# Patient Record
Sex: Male | Born: 1965 | Race: Black or African American | Hispanic: No | Marital: Married | Smoking: Never smoker
Health system: Southern US, Community
[De-identification: ages and names within clinical notes are randomized; demographics above are authoritative.]

## PROBLEM LIST (undated history)

## (undated) DIAGNOSIS — I219 Acute myocardial infarction, unspecified: Secondary | ICD-10-CM

## (undated) DIAGNOSIS — I10 Essential (primary) hypertension: Secondary | ICD-10-CM

## (undated) HISTORY — PX: KNEE SURGERY: SHX244

## (undated) HISTORY — PX: HERNIA REPAIR: SHX51

## (undated) HISTORY — PX: SHOULDER SURGERY: SHX246

---

## 2003-10-16 ENCOUNTER — Inpatient Hospital Stay (HOSPITAL_COMMUNITY): Admission: EM | Admit: 2003-10-16 | Discharge: 2003-10-18 | Payer: Self-pay | Admitting: Emergency Medicine

## 2003-10-16 IMAGING — CR DG CHEST 2V
2 series · 2 of 2 positions shown · non-contrast
Comparison: none

CLINICAL DATA: Chest pain. 
 TWO VIEW CHEST ? [DATE]

[view not recorded (1 of 2)]
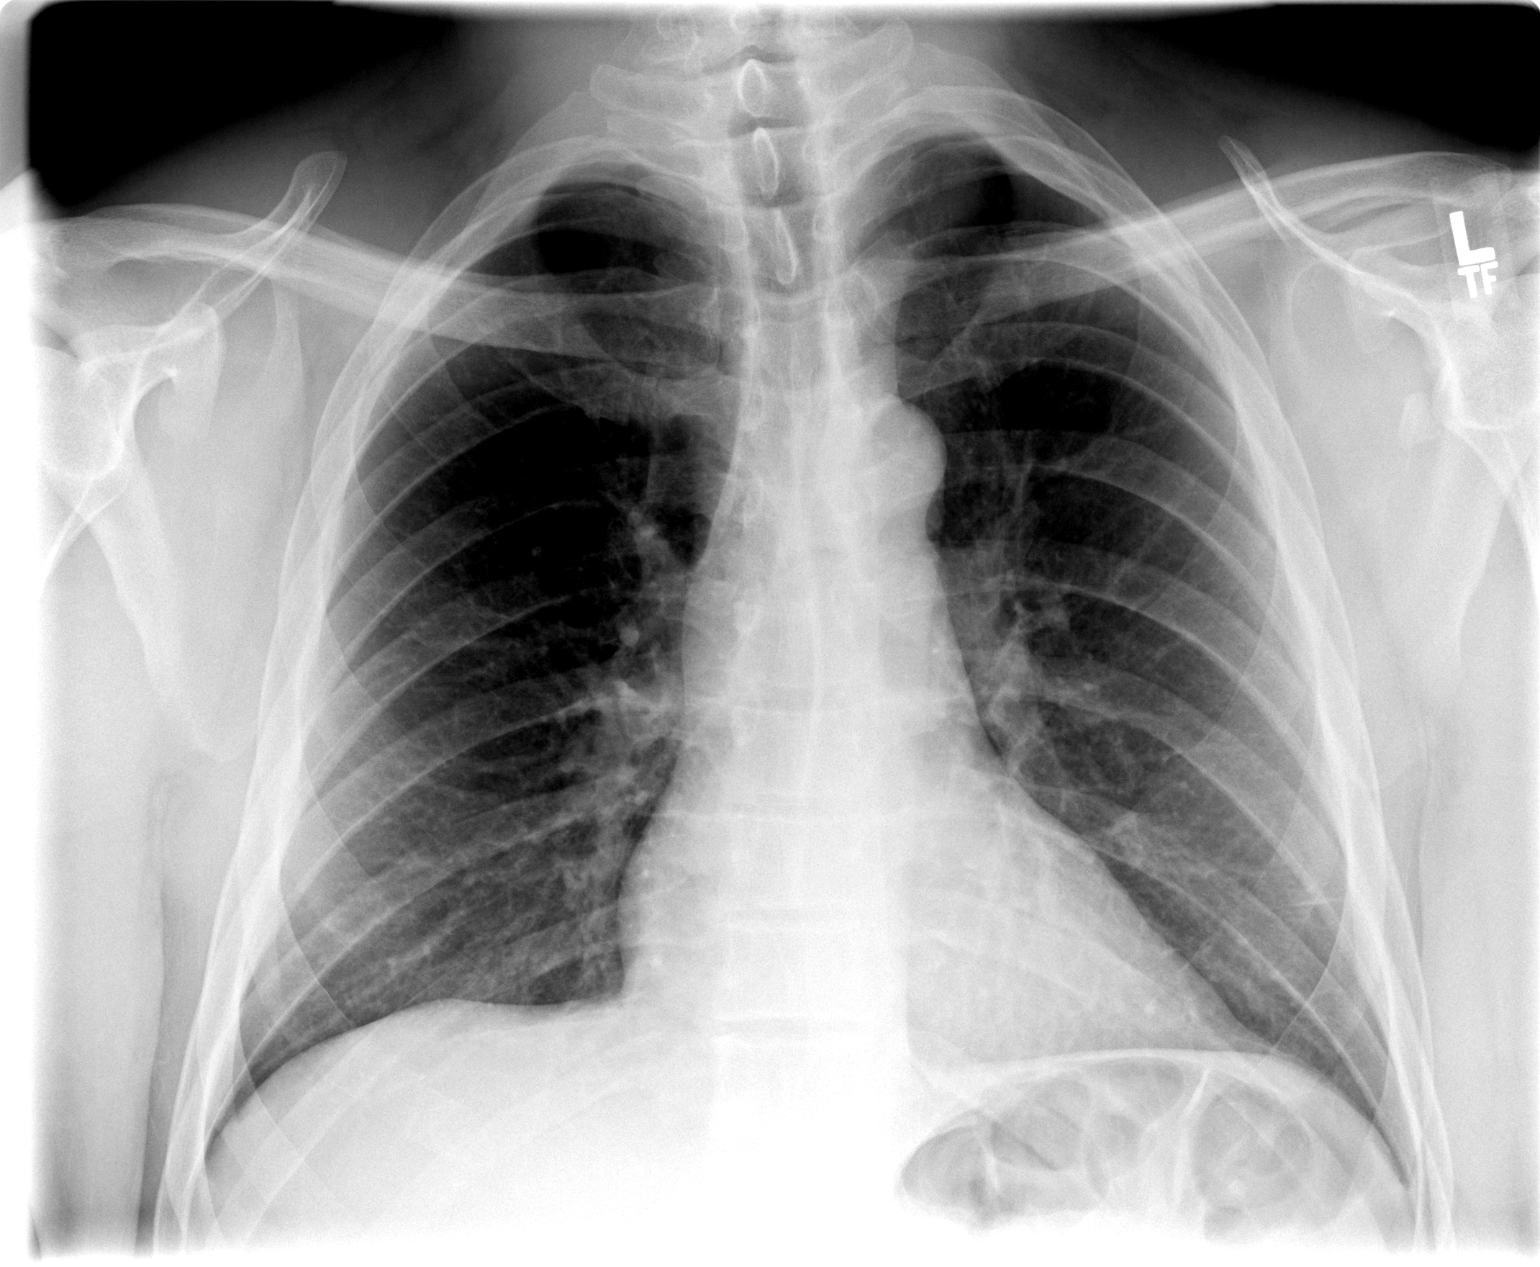

[view not recorded (2 of 2)]
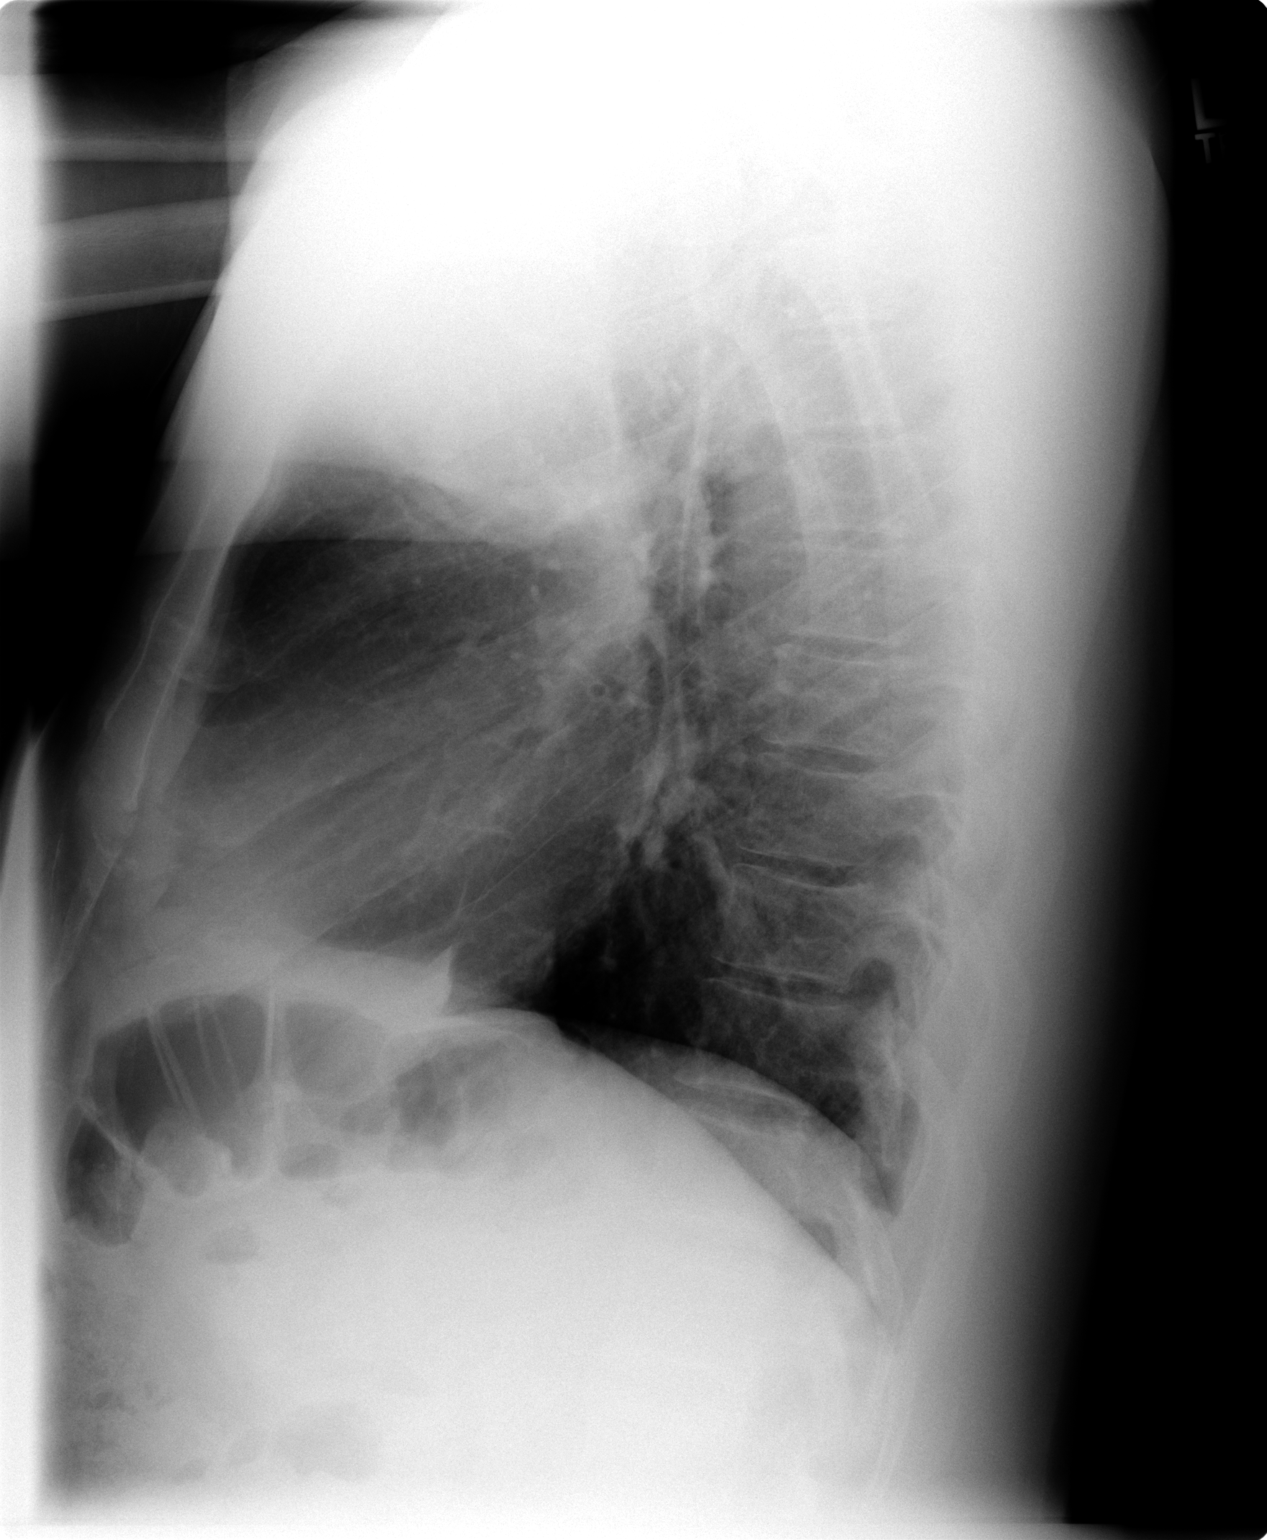

[2 of 2 positions shown; findings below may reference images not displayed]

FINDINGS: Normal cardiomediastinal silhouette.  The lungs are clear.  No evidence of acute cardiopulmonary disease.  
 IMPRESSION
 No evidence of acute cardiopulmonary disease.

## 2003-10-17 IMAGING — NM NM MYOCAR PERF WALL MOTION
2 series · 12 of 12 positions shown · non-contrast
Comparison: none

CLINICAL DATA: This is a 39-year-old man with no known coronary artery disease and atypical chest discomfort who was admitted to [HOSPITAL] and ruled out for myocardial infarction by enzymes.  Had no evidence of ischemic changes on his EKG and this is a risk stratification study.  Of note is this gentleman is incarcerated in [REDACTED] and he gets his primary care from the [facility] system.
 MYOCARDIAL PERFUSION IMAGING SCAN
 [DATE] mCi [SL] Cardiolite was injected at rest.  30.0 mCi [SL] Cardiolite was injected at peak exercise.  
 DETAILS OF THE PROCEDURE:  Following the infusion of low dose Technetium 99m Cardiolite, SPECT imaging of the chest was performed.  The patient then exercised on a Bruce protocol and when he had attained at least 85% of max predicted heart rate for his age, high dose Technetium 99m Cardiolite was infused.  SPECT imaging of the chest was performed.  The images are recreated in the short axis, horizontal long, and vertical long axes, and the post stress images were used in a gating protocol to assess left ventricular, wall motion, and function.  
 RESULTS:  There is uniform perfusion throughout all myocardial segments.  There is no evidence of ischemia or scar.  The overall ejection fraction is 59%.  There are no wall motion abnormalities seen.  
 STRESS TEST:  The patient was able to exercise 6 minutes and 44 seconds of a Bruce protocol attaining 7 mets of exercise.  His heart rate increased from 85 beats a minute to 165 beats a minute which is 92% of his max predicted heart rate for his age.  His blood pressure increased from 120/90 to 208/90.  During that time, he had mild chest discomfort which increased with exertion and resolved with recovery.  He had a few PVCs, but no ischemic ST-T wave changes with the discomfort and exercise.  Reason for stopping the test was fatigue.  
 IMPRESSION
 This is a low risk scan in a patient with no known coronary artery disease.  Clinical correlation is advised.
 CC:  Dr. [facility]

[Series 1: cr cardiac tc low dose · 6.52mm/px · 6 of 64 frames shown]
[frame 6/64]
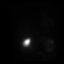
[frame 16/64]
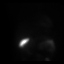
[frame 27/64]
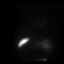
[frame 38/64]
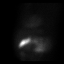
[frame 48/64]
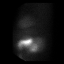
[frame 59/64]
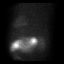

[Series 1: cs cardiac tc hi dose · 6.52mm/px · 6 of 512 frames shown]
[frame 43/512]
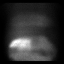
[frame 128/512]
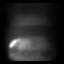
[frame 214/512]
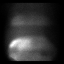
[frame 299/512]
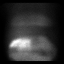
[frame 384/512]
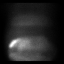
[frame 470/512]
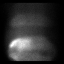

[12 of 12 positions shown; findings below may reference images not displayed]

## 2021-03-30 ENCOUNTER — Encounter (HOSPITAL_COMMUNITY): Payer: Self-pay

## 2021-03-30 ENCOUNTER — Other Ambulatory Visit: Payer: Self-pay

## 2021-03-30 ENCOUNTER — Emergency Department (HOSPITAL_COMMUNITY)

## 2021-03-30 ENCOUNTER — Inpatient Hospital Stay (HOSPITAL_COMMUNITY)
Admission: EM | Admit: 2021-03-30 | Discharge: 2021-04-01 | DRG: 312 | Attending: Internal Medicine | Admitting: Internal Medicine

## 2021-03-30 DIAGNOSIS — R079 Chest pain, unspecified: Secondary | ICD-10-CM | POA: Diagnosis present

## 2021-03-30 DIAGNOSIS — R200 Anesthesia of skin: Secondary | ICD-10-CM

## 2021-03-30 DIAGNOSIS — R202 Paresthesia of skin: Secondary | ICD-10-CM

## 2021-03-30 DIAGNOSIS — E538 Deficiency of other specified B group vitamins: Secondary | ICD-10-CM | POA: Diagnosis present

## 2021-03-30 DIAGNOSIS — I252 Old myocardial infarction: Secondary | ICD-10-CM

## 2021-03-30 DIAGNOSIS — Z20822 Contact with and (suspected) exposure to covid-19: Secondary | ICD-10-CM | POA: Diagnosis present

## 2021-03-30 DIAGNOSIS — I10 Essential (primary) hypertension: Secondary | ICD-10-CM

## 2021-03-30 DIAGNOSIS — R55 Syncope and collapse: Secondary | ICD-10-CM | POA: Diagnosis not present

## 2021-03-30 DIAGNOSIS — Z8673 Personal history of transient ischemic attack (TIA), and cerebral infarction without residual deficits: Secondary | ICD-10-CM

## 2021-03-30 DIAGNOSIS — Z888 Allergy status to other drugs, medicaments and biological substances status: Secondary | ICD-10-CM

## 2021-03-30 DIAGNOSIS — D7589 Other specified diseases of blood and blood-forming organs: Secondary | ICD-10-CM | POA: Diagnosis present

## 2021-03-30 DIAGNOSIS — Z91018 Allergy to other foods: Secondary | ICD-10-CM

## 2021-03-30 DIAGNOSIS — M722 Plantar fascial fibromatosis: Secondary | ICD-10-CM | POA: Diagnosis present

## 2021-03-30 DIAGNOSIS — Z880 Allergy status to penicillin: Secondary | ICD-10-CM

## 2021-03-30 HISTORY — DX: Essential (primary) hypertension: I10

## 2021-03-30 HISTORY — DX: Acute myocardial infarction, unspecified: I21.9

## 2021-03-30 LAB — PROTIME-INR
INR: 0.9 (ref 0.8–1.2)
Prothrombin Time: 12.6 seconds (ref 11.4–15.2)

## 2021-03-30 LAB — URINALYSIS, ROUTINE W REFLEX MICROSCOPIC
Bilirubin Urine: NEGATIVE
Glucose, UA: NEGATIVE mg/dL
Hgb urine dipstick: NEGATIVE
Ketones, ur: NEGATIVE mg/dL
Leukocytes,Ua: NEGATIVE
Nitrite: NEGATIVE
Protein, ur: NEGATIVE mg/dL
Specific Gravity, Urine: 1.014 (ref 1.005–1.030)
pH: 6 (ref 5.0–8.0)

## 2021-03-30 LAB — CBC WITH DIFFERENTIAL/PLATELET
Abs Immature Granulocytes: 0.01 10*3/uL (ref 0.00–0.07)
Basophils Absolute: 0 10*3/uL (ref 0.0–0.1)
Basophils Relative: 1 %
Eosinophils Absolute: 0.3 10*3/uL (ref 0.0–0.5)
Eosinophils Relative: 9 %
HCT: 41.1 % (ref 39.0–52.0)
Hemoglobin: 13.6 g/dL (ref 13.0–17.0)
Immature Granulocytes: 0 %
Lymphocytes Relative: 41 %
Lymphs Abs: 1.7 10*3/uL (ref 0.7–4.0)
MCH: 34.7 pg — ABNORMAL HIGH (ref 26.0–34.0)
MCHC: 33.1 g/dL (ref 30.0–36.0)
MCV: 104.8 fL — ABNORMAL HIGH (ref 80.0–100.0)
Monocytes Absolute: 0.4 10*3/uL (ref 0.1–1.0)
Monocytes Relative: 10 %
Neutro Abs: 1.6 10*3/uL — ABNORMAL LOW (ref 1.7–7.7)
Neutrophils Relative %: 39 %
Platelets: 190 10*3/uL (ref 150–400)
RBC: 3.92 MIL/uL — ABNORMAL LOW (ref 4.22–5.81)
RDW: 11.5 % (ref 11.5–15.5)
WBC: 4 10*3/uL (ref 4.0–10.5)
nRBC: 0 % (ref 0.0–0.2)

## 2021-03-30 LAB — COMPREHENSIVE METABOLIC PANEL
ALT: 14 U/L (ref 0–44)
AST: 15 U/L (ref 15–41)
Albumin: 4.1 g/dL (ref 3.5–5.0)
Alkaline Phosphatase: 105 U/L (ref 38–126)
Anion gap: 6 (ref 5–15)
BUN: 18 mg/dL (ref 6–20)
CO2: 28 mmol/L (ref 22–32)
Calcium: 9.4 mg/dL (ref 8.9–10.3)
Chloride: 103 mmol/L (ref 98–111)
Creatinine, Ser: 1.42 mg/dL — ABNORMAL HIGH (ref 0.61–1.24)
GFR, Estimated: 58 mL/min — ABNORMAL LOW (ref >60)
Glucose, Bld: 91 mg/dL (ref 70–99)
Potassium: 4 mmol/L (ref 3.5–5.1)
Sodium: 137 mmol/L (ref 135–145)
Total Bilirubin: 0.9 mg/dL (ref 0.3–1.2)
Total Protein: 7.6 g/dL (ref 6.5–8.1)

## 2021-03-30 LAB — TROPONIN I (HIGH SENSITIVITY): Troponin I (High Sensitivity): 3 ng/L (ref <18)

## 2021-03-30 IMAGING — CT CT ANGIO HEAD-NECK (W OR W/O PERF)
2 of 7 series · 8 of 33 positions shown · IV contrast (Omnipaque or Isovue)
Comparison: Prior head CT from earlier the same day.

CLINICAL DATA: Initial evaluation for neuro deficit, stroke
suspected.

EXAM:
CT ANGIOGRAPHY HEAD AND NECK
TECHNIQUE: Multidetector CT imaging of the head and neck was performed using
the standard protocol during bolus administration of intravenous
contrast. Multiplanar CT image reconstructions and MIPs were
obtained to evaluate the vascular anatomy. Carotid stenosis
measurements (when applicable) are obtained utilizing NASCET
criteria, using the distal internal carotid diameter as the
denominator.
CONTRAST:  75mL OMNIPAQUE IOHEXOL 350 MG/ML SOLN

[Series 5: cta head & neck · axial · 0.54mm/px · z∈[+1528,+1650]mm · 2 of 183 slices shown]
[im 61/183  soft-tissue]
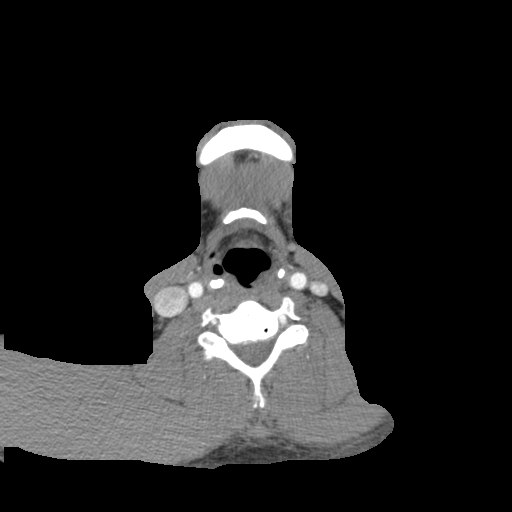
[im 122/183  soft-tissue]
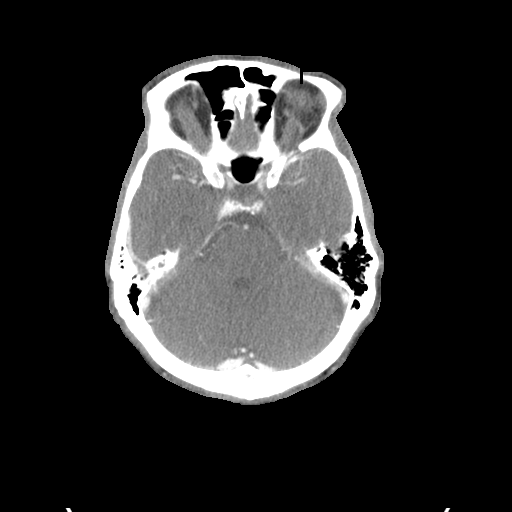

[Series 7: ax thins · axial · 0.44mm/px · z∈[+1459,+1714]mm · 6 of 358 slices shown]
[im 52/358  soft-tissue]
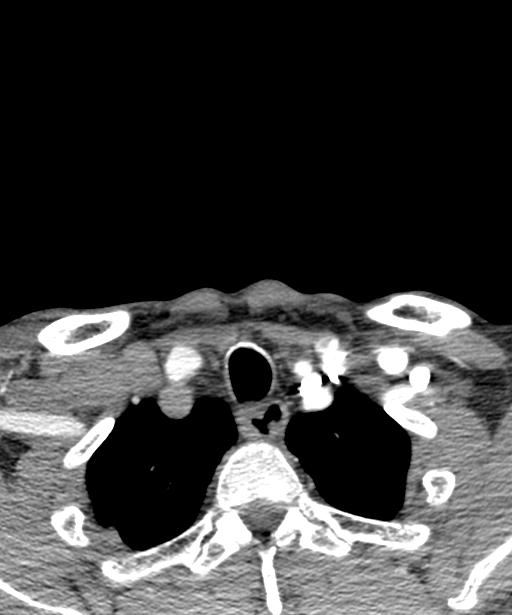
[im 103/358  bone]
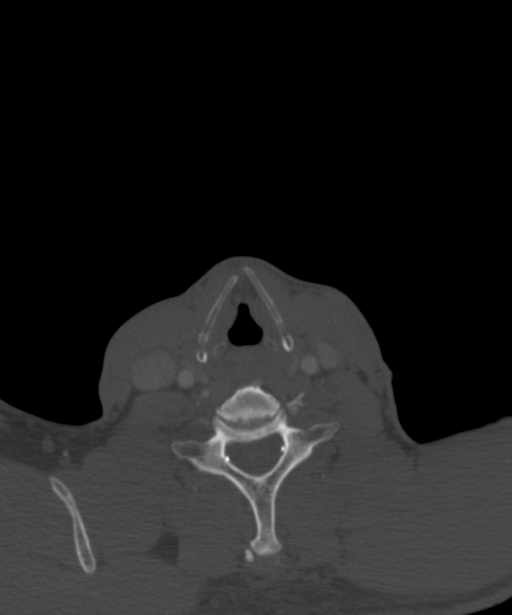
[im 154/358  soft-tissue]
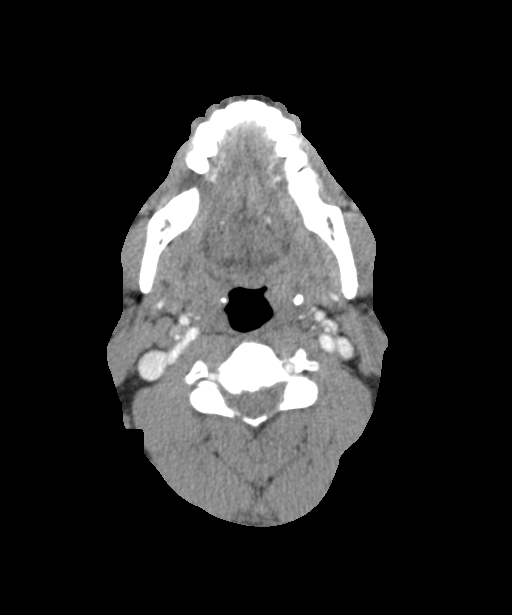
[im 205/358  bone]
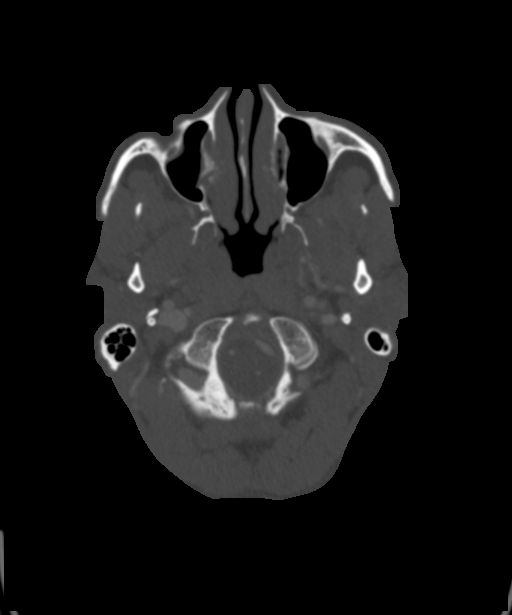
[im 256/358  soft-tissue]
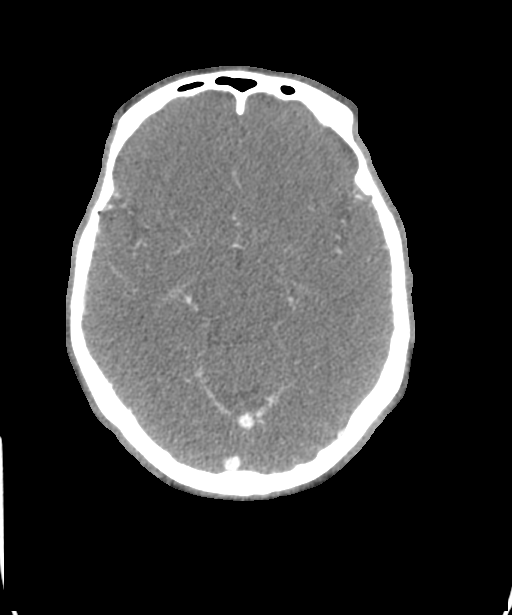
[im 307/358  bone]
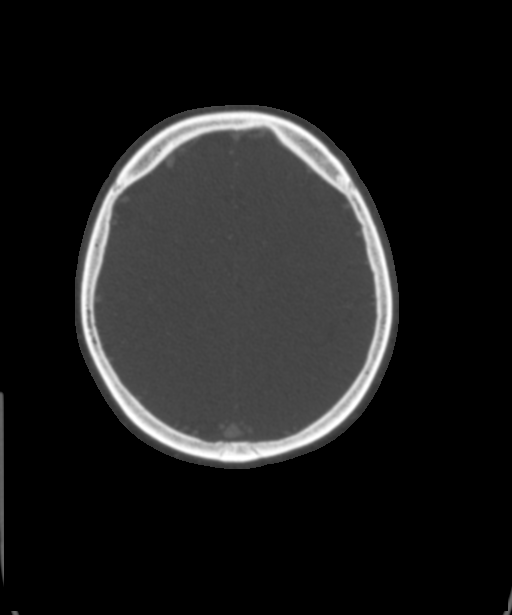

[8 of 33 positions shown; findings below may reference images not displayed]

FINDINGS: CTA NECK FINDINGS

Aortic arch: Visualized aortic arch normal in caliber with normal 3
vessel morphology. No stenosis or other abnormality about the origin
of the great vessels.

Right carotid system: Right common and internal carotid arteries
widely patent without stenosis, dissection or occlusion.

Left carotid system: Left common and internal carotid arteries
patent without stenosis, dissection or occlusion. Mild atheromatous
change about the origin of the cervical left ICA without stenosis.

Vertebral arteries: Both vertebral arteries arise from the
subclavian arteries. No proximal subclavian artery stenosis. Both
vertebral arteries widely patent without stenosis, dissection or
occlusion.

Skeleton: No discrete or worrisome osseous lesions. Ordinary for age
multilevel cervical spondylosis.

Other neck: No other acute soft tissue abnormality within the neck.

Upper chest: Visualized upper chest demonstrates no acute finding.

Review of the MIP images confirms the above findings

CTA HEAD FINDINGS

Anterior circulation: Both internal carotid arteries widely patent
to the termini without stenosis. A1 segments widely patent. Normal
anterior communicating artery complex. Both anterior cerebral
arteries widely patent to their distal aspects without stenosis. No
M1 stenosis or occlusion. Normal MCA bifurcations. Distal MCA
branches well perfused and symmetric.

Posterior circulation: Both V4 segments patent to the
vertebrobasilar junction without stenosis. Both PICA origins patent
and normal. Basilar widely patent to its distal aspect without
stenosis. Superior cerebellar arteries patent bilaterally. Both PCAs
primarily supplied via the basilar and are well perfused to there
distal aspects.

Venous sinuses: Patent allowing for timing the contrast bolus.

Anatomic variants: None significant.

Review of the MIP images confirms the above findings
IMPRESSION: Negative CTA of the head and neck. No large vessel occlusion,
hemodynamically significant stenosis, or other acute vascular
abnormality.

## 2021-03-30 IMAGING — DX DG CHEST 2V
2 series · 2 of 2 positions shown · non-contrast
Comparison: [DATE] chest radiograph.

CLINICAL DATA: Syncope

EXAM:
CHEST - 2 VIEW

[chest pa]
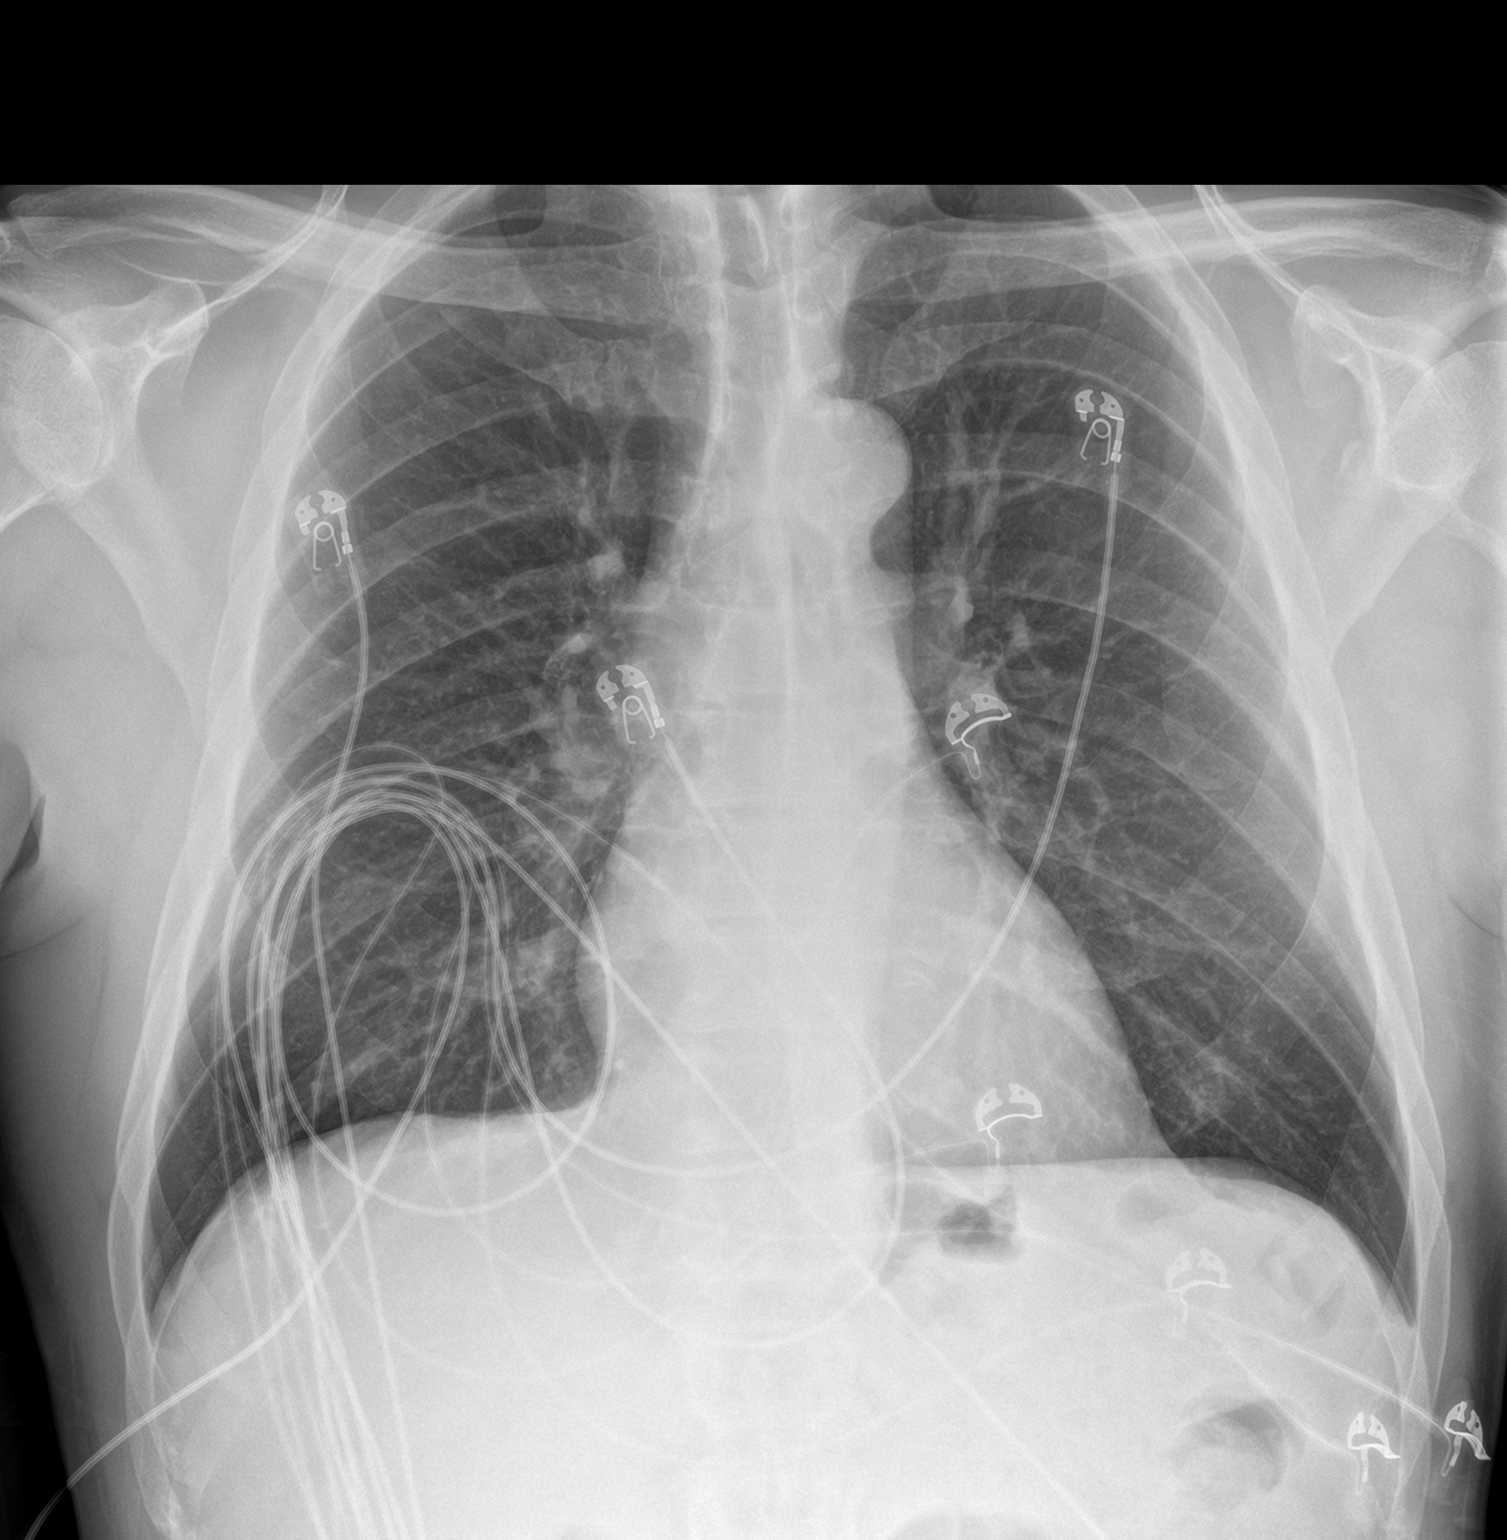

[chest lat]
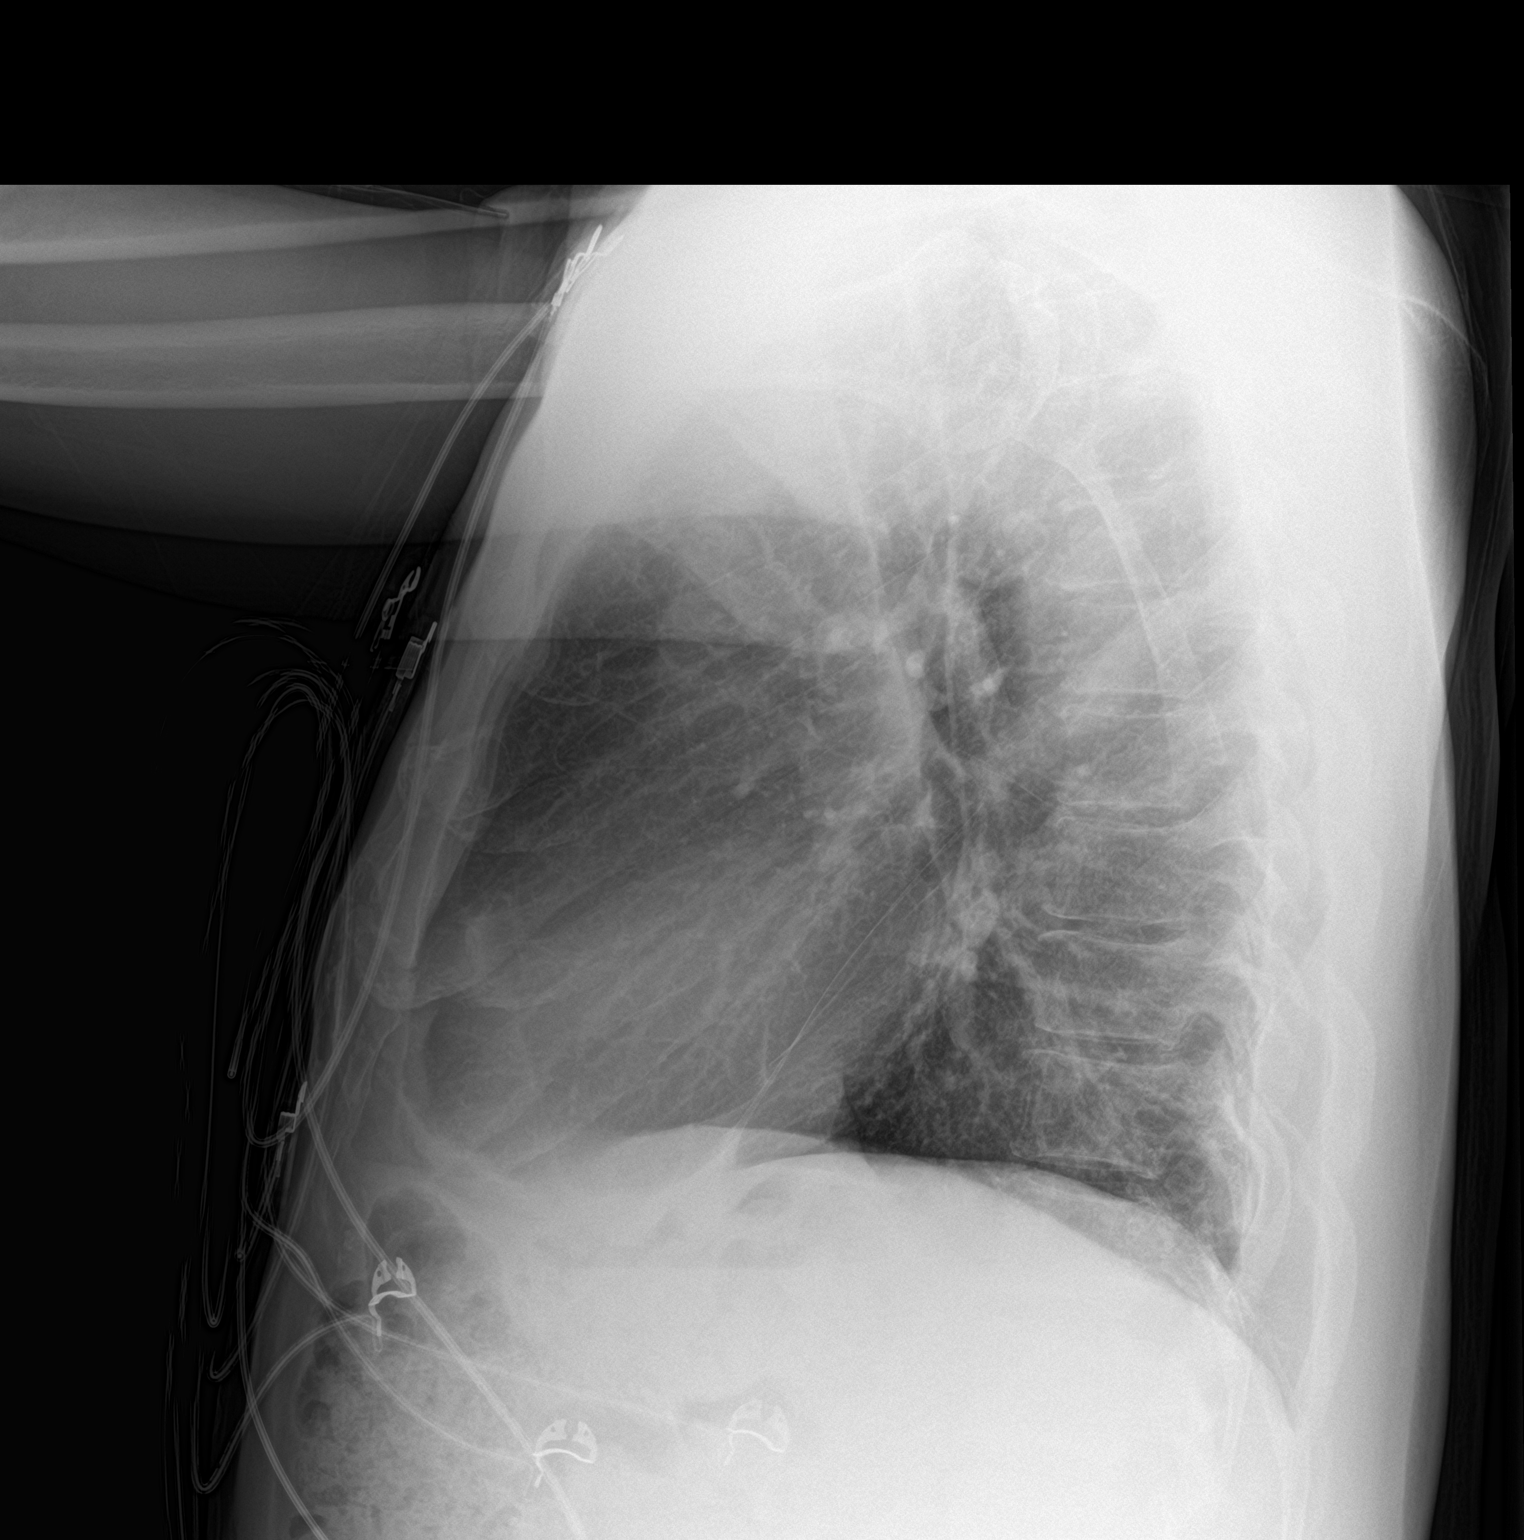

[2 of 2 positions shown; findings below may reference images not displayed]

FINDINGS: Stable cardiomediastinal silhouette with normal heart size. No
pneumothorax. No pleural effusion. Lungs appear clear, with no acute
consolidative airspace disease and no pulmonary edema.
IMPRESSION: No active cardiopulmonary disease.

## 2021-03-30 IMAGING — CT CT HEAD W/O CM
3 series · 16 of 47 positions shown, 19 images · non-contrast
Comparison: None.

CLINICAL DATA: Syncopal episode

EXAM:
CT HEAD WITHOUT CONTRAST
TECHNIQUE: Contiguous axial images were obtained from the base of the skull
through the vertex without intravenous contrast.

[Series 2: head w o · axial · 0.42mm/px · z∈[+1578,+1718]mm · 10 of 34 slices shown, 13 images]
[im 3/34  brain]
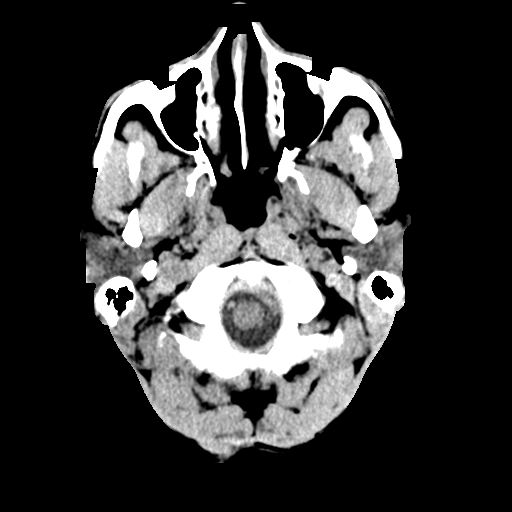
[im 3/34  bone]
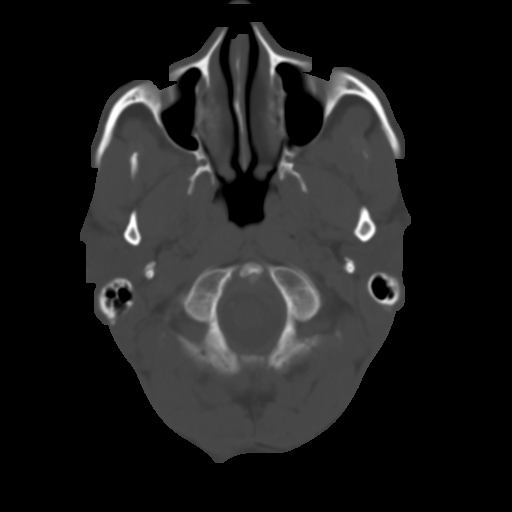
[im 6/34  brain]
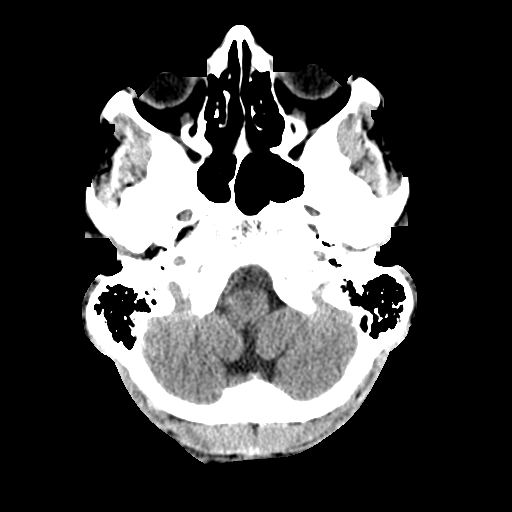
[im 10/34  brain]
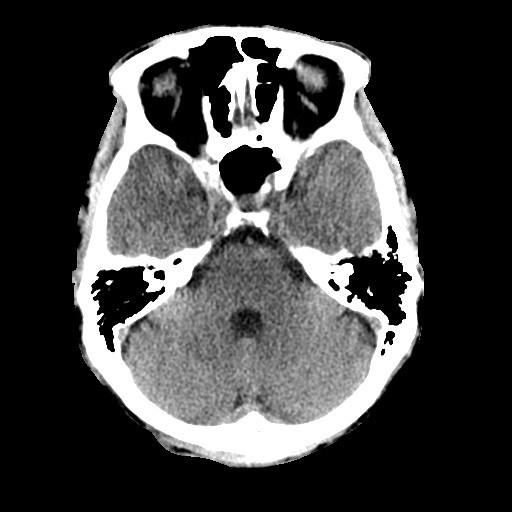
[im 12/34  brain]
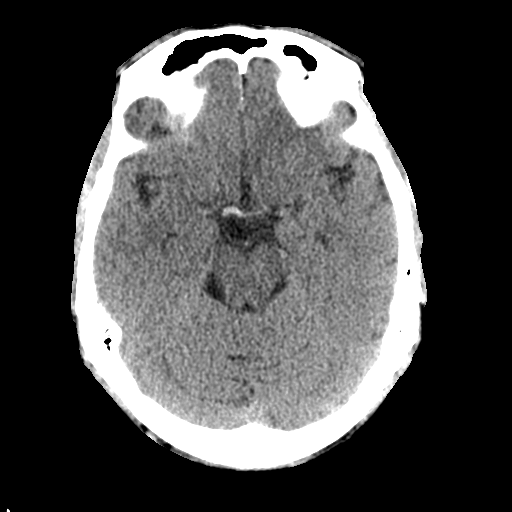
[im 15/34  brain]
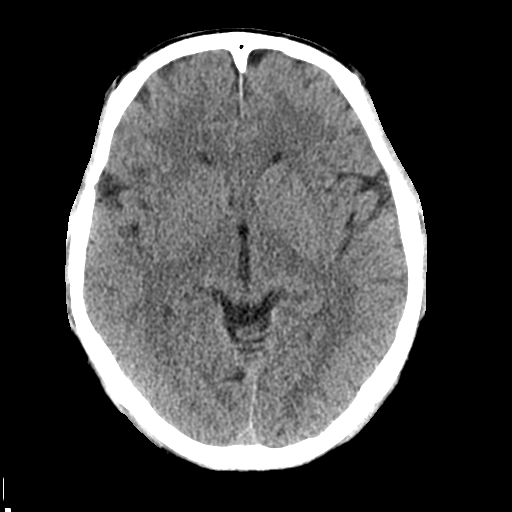
[im 15/34  bone]
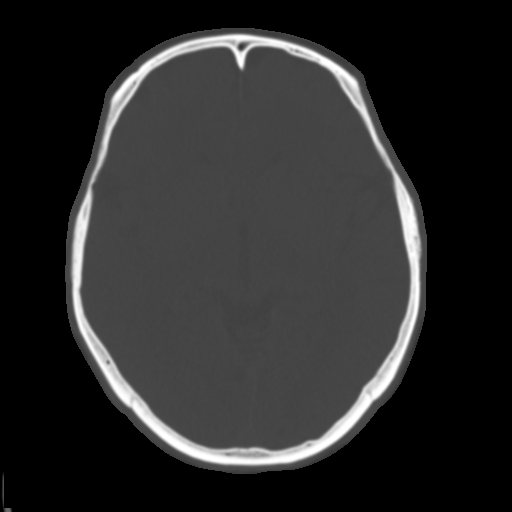
[im 19/34  brain]
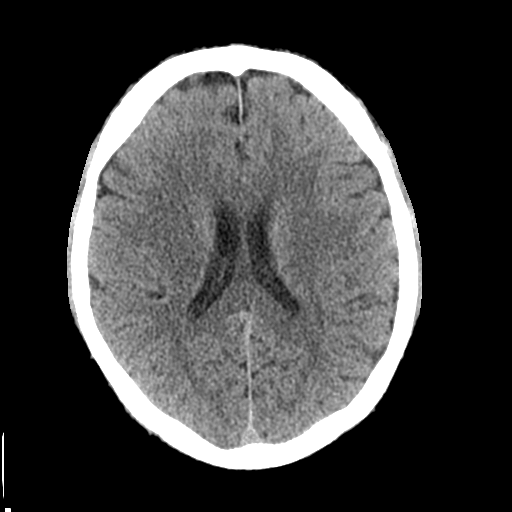
[im 22/34  brain]
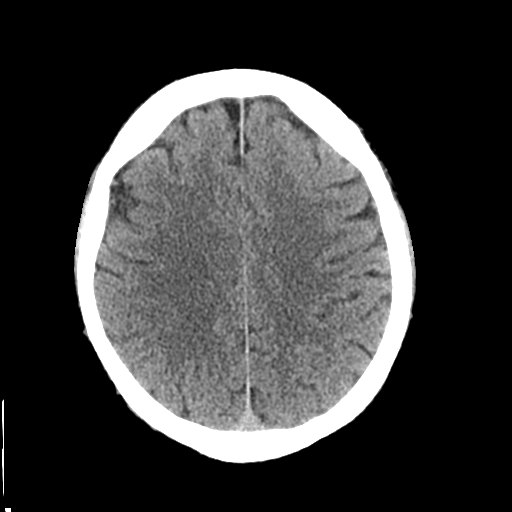
[im 26/34  brain]
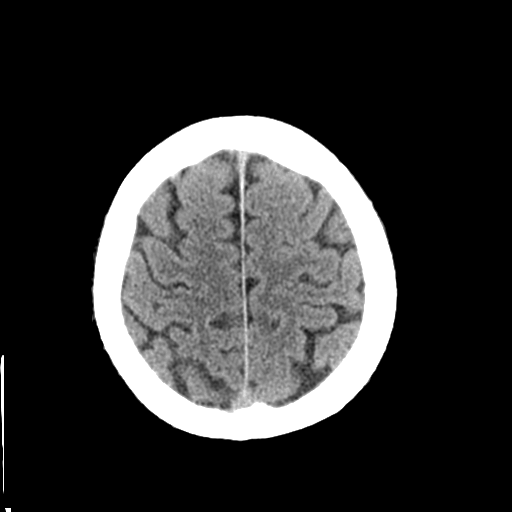
[im 28/34  brain]
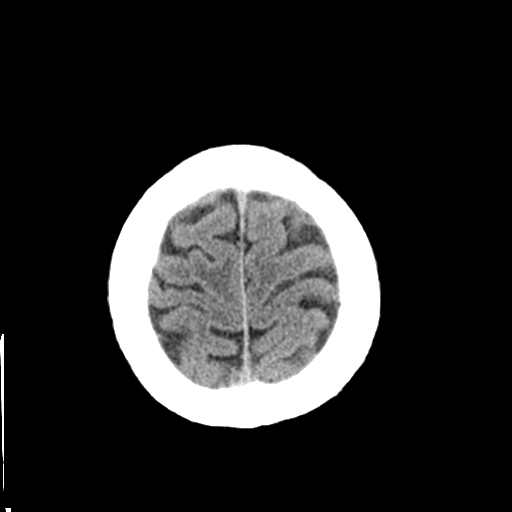
[im 28/34  bone]
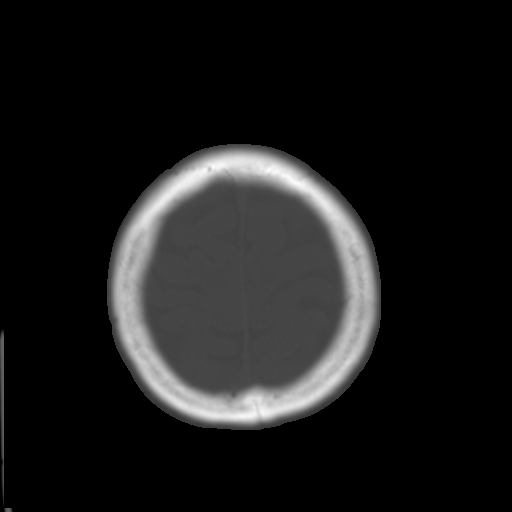
[im 31/34  brain]
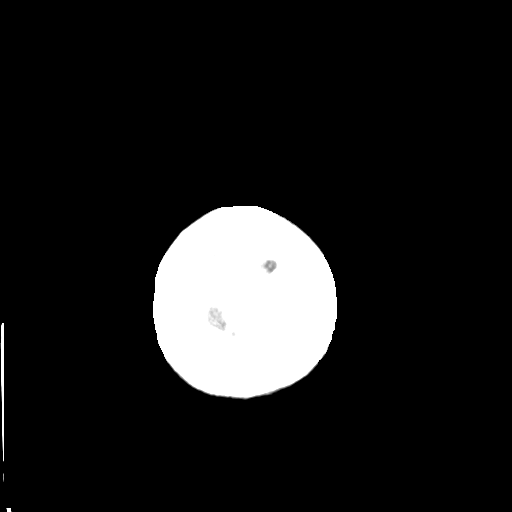

[Series 4: coronal soft · coronal · 0.33mm/px · 3 of 67 slices shown]
[im 23/67  brain]
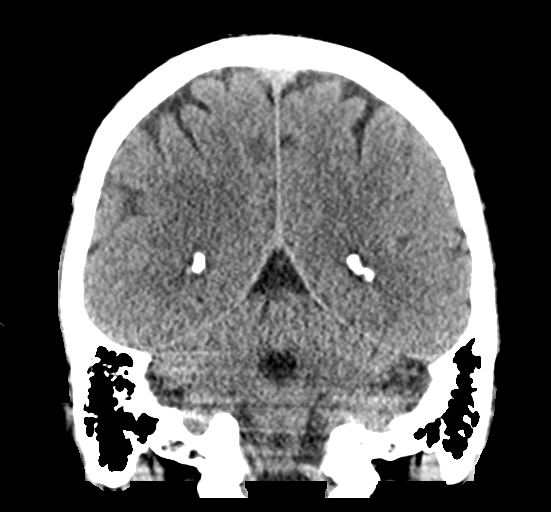
[im 30/67  brain]
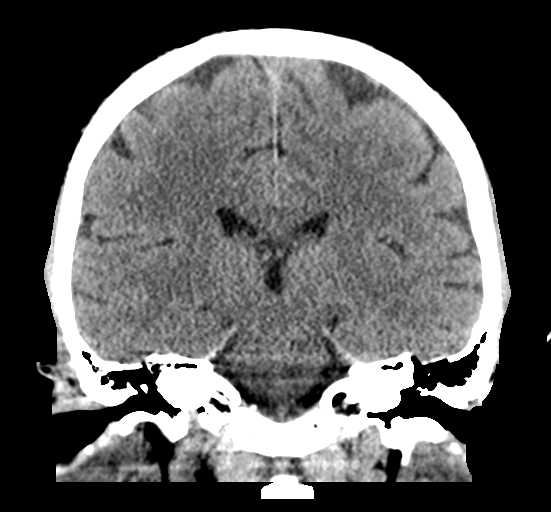
[im 37/67  brain]
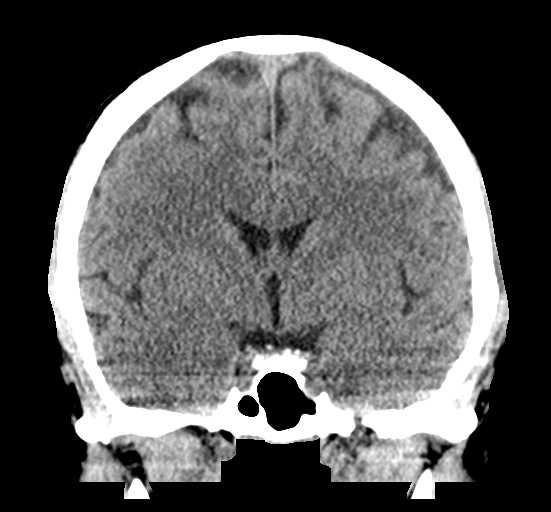

[Series 5: sagittal soft · sagittal · 0.36mm/px · 3 of 58 slices shown]
[im 20/58  brain]
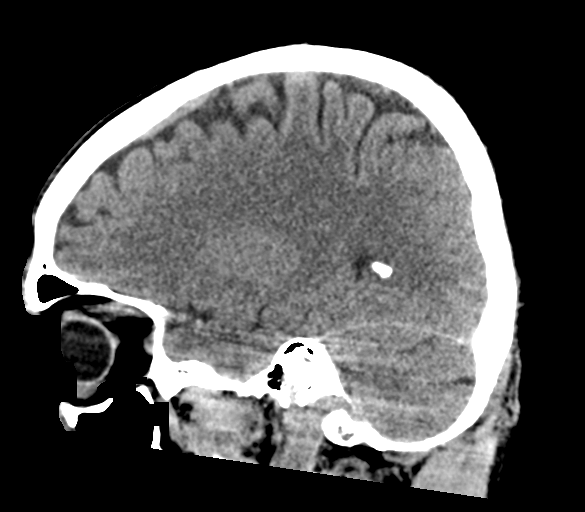
[im 29/58  brain]
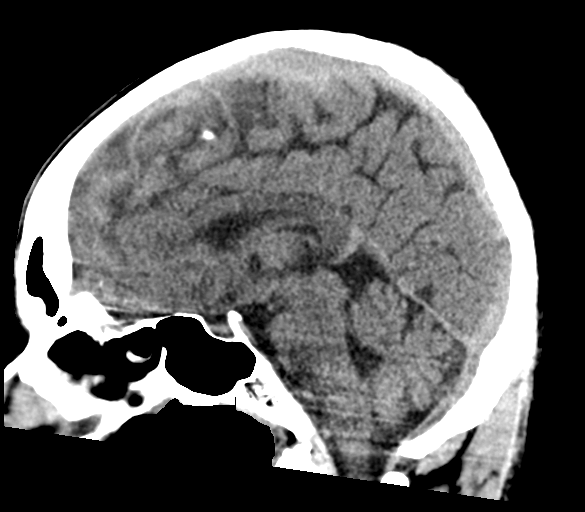
[im 39/58  brain]
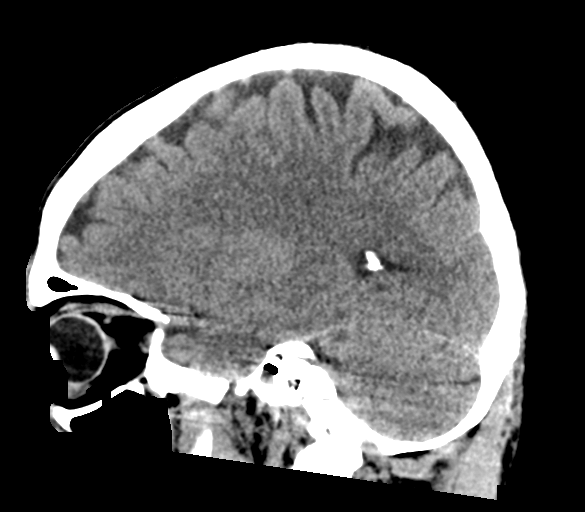

[16 of 47 positions shown; findings below may reference images not displayed]

FINDINGS: Brain: No acute territorial infarction, hemorrhage or intracranial
mass. The ventricles are nonenlarged

Vascular: No hyperdense vessel or unexpected calcification.

Skull: Normal. Negative for fracture or focal lesion.

Sinuses/Orbits: No acute finding.

Other: None
IMPRESSION: Negative non contrasted CT appearance of the brain

## 2021-03-30 MED ORDER — IOHEXOL 350 MG/ML SOLN
75.0000 mL | Freq: Once | INTRAVENOUS | Status: AC | PRN
  Administered 2021-03-30: 75 mL via INTRAVENOUS

## 2021-03-30 MED ORDER — SODIUM CHLORIDE 0.9 % IV SOLN: INTRAVENOUS | Status: DC

## 2021-03-30 MED ORDER — SODIUM CHLORIDE 0.9 % IV BOLUS
1000.0000 mL | Freq: Once | INTRAVENOUS | Status: AC
  Administered 2021-03-30: 1000 mL via INTRAVENOUS

## 2021-03-30 NOTE — ED Triage Notes (Signed)
Pt brought in by [facility] from [facility] in [place] for chest pain. EMS says pt was shaking and diaphoretic upon arrival, appearing very anxious- EKG unremarkable. CP started today, subsided when pt got in EMS truck. Pt also reports tinging to left side x 1 week.

## 2021-03-30 NOTE — ED Provider Notes (Signed)
[facility] EMERGENCY DEPARTMENT Provider Note   CSN: [id number] Arrival date & time: 03/30/21  2057     History Chief Complaint  Patient presents with   Chest Pain    Marcus Dunn is a 55 y.o. male.  Pt presents to the ED today with syncope.  Pt is at the [facility] and said he sat down with his tablet and passed out.  He had some associated cp with it as well.  No cp now.  Pt has been complaining of left sided tingling that has been going on for a week.  He has asked to see the doctor at the prison, but has not seen on yet.  Pt denies any sob.  Pt denies any other neuro sx.  Pt does have a hx of htn and used to be on atenolol, but has not been on it in a few years.  He also said he was seen at a hospital in [place] and was told that he needed a stent in his heart or his legs.  He is not sure.  It never got done because he was released from prison and never followed up.        Past Medical History:  Diagnosis Date   Hypertension    MI (myocardial infarction) (HCC)   No definite MI.  Pt said he was told he might be having one.  No cardiac cath done.  There are no problems to display for this patient.   Past Surgical History:  Procedure Laterality Date   HERNIA REPAIR     KNEE SURGERY     SHOULDER SURGERY         No family history on file.  Social History   Tobacco Use   Smoking status: Never   Smokeless tobacco: Never  Vaping Use   Vaping Use: Never used  Substance Use Topics   Alcohol use: Not Currently   Drug use: Not Currently    Home Medications Prior to Admission medications   Not on File    Allergies    Chocolate, Hydrochlorothiazide, Lisinopril, and Penicillins  Review of Systems   Review of Systems  Cardiovascular:  Positive for chest pain.  Neurological:  Positive for syncope.  All other systems reviewed and are negative.  Physical Exam Updated Vital Signs BP 124/79   Pulse 60   Temp 98.4 F (36.9 C) (Oral)   Resp 14   SpO2  100%   Physical Exam Vitals and nursing note reviewed.  Constitutional:      Appearance: He is well-developed.  HENT:     Head: Normocephalic and atraumatic.  Eyes:     Extraocular Movements: Extraocular movements intact.     Pupils: Pupils are equal, round, and reactive to light.  Cardiovascular:     Rate and Rhythm: Normal rate and regular rhythm.     Heart sounds: Normal heart sounds.  Pulmonary:     Effort: Pulmonary effort is normal.     Breath sounds: Normal breath sounds.  Abdominal:     General: Bowel sounds are normal.     Palpations: Abdomen is soft.  Musculoskeletal:        General: Normal range of motion.     Cervical back: Normal range of motion and neck supple.  Skin:    General: Skin is warm.     Capillary Refill: Capillary refill takes less than 2 seconds.  Neurological:     General: No focal deficit present.  Mental Status: He is alert and oriented to person, place, and time.  Psychiatric:        Mood and Affect: Mood normal.        Behavior: Behavior normal.    ED Results / Procedures / Treatments   Labs (all labs ordered are listed, but only abnormal results are displayed) Labs Reviewed  CBC WITH DIFFERENTIAL/PLATELET - Abnormal; Notable for the following components:      Result Value   RBC 3.92 (*)    MCV 104.8 (*)    MCH 34.7 (*)    Neutro Abs 1.6 (*)    All other components within normal limits  COMPREHENSIVE METABOLIC PANEL - Abnormal; Notable for the following components:   Creatinine, Ser 1.42 (*)    GFR, Estimated 58 (*)    All other components within normal limits  RESP PANEL BY RT-PCR (FLU A&B, COVID) ARPGX2  PROTIME-INR  URINALYSIS, ROUTINE W REFLEX MICROSCOPIC  CBG MONITORING, ED  TROPONIN I (HIGH SENSITIVITY)    EKG EKG Interpretation  Date/Time:  Friday March 30 2021 21:17:08 EDT Ventricular Rate:  59 PR Interval:  158 QRS Duration: 107 QT Interval:  424 QTC Calculation: 420 R Axis:   80 Text Interpretation: Sinus  rhythm Probable left ventricular hypertrophy Borderline ST elevation, inferior leads No old tracing to compare Confirmed by Jacalyn Lefevre [phone]) on 03/30/2021 9:34:43 PM  Radiology DG Chest 2 View  Result Date: 03/30/2021 CLINICAL DATA:  Syncope EXAM: CHEST - 2 VIEW COMPARISON:  10/16/2003 chest radiograph. FINDINGS: Stable cardiomediastinal silhouette with normal heart size. No pneumothorax. No pleural effusion. Lungs appear clear, with no acute consolidative airspace disease and no pulmonary edema. IMPRESSION: No active cardiopulmonary disease. Electronically Signed   By: Delbert Phenix M.D.   On: 03/30/2021 21:53   CT HEAD WO CONTRAST  Result Date: 03/30/2021 CLINICAL DATA:  Syncopal episode EXAM: CT HEAD WITHOUT CONTRAST TECHNIQUE: Contiguous axial images were obtained from the base of the skull through the vertex without intravenous contrast. COMPARISON:  None. FINDINGS: Brain: No acute territorial infarction, hemorrhage or intracranial mass. The ventricles are nonenlarged Vascular: No hyperdense vessel or unexpected calcification. Skull: Normal. Negative for fracture or focal lesion. Sinuses/Orbits: No acute finding. Other: None IMPRESSION: Negative non contrasted CT appearance of the brain Electronically Signed   By: Jasmine Pang M.D.   On: 03/30/2021 21:46    Procedures Procedures   Medications Ordered in ED Medications  sodium chloride 0.9 % bolus 1,000 mL (1,000 mLs Intravenous New Bag/Given 03/30/21 2125)    And  0.9 %  sodium chloride infusion ( Intravenous New Bag/Given 03/30/21 2156)  iohexol (OMNIPAQUE) 350 MG/ML injection 75 mL (75 mLs Intravenous Contrast Given 03/30/21 2302)    ED Course  I have reviewed the triage vital signs and the nursing notes.  Pertinent labs & imaging results that were available during my care of the patient were reviewed by me and considered in my medical decision making (see chart for details).    MDM Rules/Calculators/A&P                            Pt had 1 bp that was elevated when he arrived, but the others were normal.  Pt's CT scan and labs were normal.  CTA ordered which is pending at shift change.  Pt signed out to Dr. Blinda Leatherwood at shift change. Final Clinical Impression(s) / ED Diagnoses Final diagnoses:  Syncope, unspecified syncope type  Rx / DC Orders ED Discharge Orders     None        Jacalyn Lefevre, MD 03/30/21 2309

## 2021-03-31 ENCOUNTER — Inpatient Hospital Stay (HOSPITAL_COMMUNITY)

## 2021-03-31 DIAGNOSIS — R55 Syncope and collapse: Secondary | ICD-10-CM | POA: Diagnosis not present

## 2021-03-31 DIAGNOSIS — E538 Deficiency of other specified B group vitamins: Secondary | ICD-10-CM | POA: Diagnosis present

## 2021-03-31 DIAGNOSIS — R079 Chest pain, unspecified: Secondary | ICD-10-CM

## 2021-03-31 DIAGNOSIS — R200 Anesthesia of skin: Secondary | ICD-10-CM | POA: Diagnosis not present

## 2021-03-31 DIAGNOSIS — M722 Plantar fascial fibromatosis: Secondary | ICD-10-CM | POA: Diagnosis present

## 2021-03-31 DIAGNOSIS — I10 Essential (primary) hypertension: Secondary | ICD-10-CM

## 2021-03-31 DIAGNOSIS — D7589 Other specified diseases of blood and blood-forming organs: Secondary | ICD-10-CM | POA: Diagnosis present

## 2021-03-31 DIAGNOSIS — Z888 Allergy status to other drugs, medicaments and biological substances status: Secondary | ICD-10-CM | POA: Diagnosis not present

## 2021-03-31 DIAGNOSIS — R202 Paresthesia of skin: Secondary | ICD-10-CM

## 2021-03-31 DIAGNOSIS — Z91018 Allergy to other foods: Secondary | ICD-10-CM | POA: Diagnosis not present

## 2021-03-31 DIAGNOSIS — I252 Old myocardial infarction: Secondary | ICD-10-CM | POA: Diagnosis not present

## 2021-03-31 DIAGNOSIS — Z8673 Personal history of transient ischemic attack (TIA), and cerebral infarction without residual deficits: Secondary | ICD-10-CM | POA: Diagnosis not present

## 2021-03-31 DIAGNOSIS — Z20822 Contact with and (suspected) exposure to covid-19: Secondary | ICD-10-CM | POA: Diagnosis present

## 2021-03-31 DIAGNOSIS — Z880 Allergy status to penicillin: Secondary | ICD-10-CM | POA: Diagnosis not present

## 2021-03-31 LAB — RESP PANEL BY RT-PCR (FLU A&B, COVID) ARPGX2
Influenza A by PCR: NEGATIVE
Influenza B by PCR: NEGATIVE
SARS Coronavirus 2 by RT PCR: NEGATIVE

## 2021-03-31 LAB — CBG MONITORING, ED: Glucose-Capillary: 91 mg/dL (ref 70–99)

## 2021-03-31 LAB — COMPREHENSIVE METABOLIC PANEL
ALT: 14 U/L (ref 0–44)
AST: 14 U/L — ABNORMAL LOW (ref 15–41)
Albumin: 3.8 g/dL (ref 3.5–5.0)
Alkaline Phosphatase: 80 U/L (ref 38–126)
Anion gap: 5 (ref 5–15)
BUN: 15 mg/dL (ref 6–20)
CO2: 29 mmol/L (ref 22–32)
Calcium: 9 mg/dL (ref 8.9–10.3)
Chloride: 104 mmol/L (ref 98–111)
Creatinine, Ser: 1.18 mg/dL (ref 0.61–1.24)
GFR, Estimated: >60 mL/min (ref >60)
Glucose, Bld: 95 mg/dL (ref 70–99)
Potassium: 3.8 mmol/L (ref 3.5–5.1)
Sodium: 138 mmol/L (ref 135–145)
Total Bilirubin: 1.4 mg/dL — ABNORMAL HIGH (ref 0.3–1.2)
Total Protein: 6.7 g/dL (ref 6.5–8.1)

## 2021-03-31 LAB — ECHOCARDIOGRAM COMPLETE
AR max vel: 2.82 cm2
AV Area VTI: 2.77 cm2
AV Area mean vel: 2.84 cm2
AV Mean grad: 4 mmHg
AV Peak grad: 8.6 mmHg
Ao pk vel: 1.47 m/s
Area-P 1/2: 3.26 cm2
Calc EF: 70.3 %
MV VTI: 2.95 cm2
S' Lateral: 3.2 cm
Single Plane A2C EF: 80.1 %
Single Plane A4C EF: 59.2 %

## 2021-03-31 LAB — CBC
HCT: 38.9 % — ABNORMAL LOW (ref 39.0–52.0)
Hemoglobin: 12.9 g/dL — ABNORMAL LOW (ref 13.0–17.0)
MCH: 34.5 pg — ABNORMAL HIGH (ref 26.0–34.0)
MCHC: 33.2 g/dL (ref 30.0–36.0)
MCV: 104 fL — ABNORMAL HIGH (ref 80.0–100.0)
Platelets: 177 10*3/uL (ref 150–400)
RBC: 3.74 MIL/uL — ABNORMAL LOW (ref 4.22–5.81)
RDW: 11.3 % — ABNORMAL LOW (ref 11.5–15.5)
WBC: 3.3 10*3/uL — ABNORMAL LOW (ref 4.0–10.5)
nRBC: 0 % (ref 0.0–0.2)

## 2021-03-31 LAB — LIPID PANEL
Cholesterol: 191 mg/dL (ref 0–200)
HDL: 62 mg/dL (ref >40)
LDL Cholesterol: 125 mg/dL — ABNORMAL HIGH (ref 0–99)
Total CHOL/HDL Ratio: 3.1 RATIO
Triglycerides: 21 mg/dL (ref <150)
VLDL: 4 mg/dL (ref 0–40)

## 2021-03-31 LAB — FOLATE: Folate: 17.2 ng/mL (ref >5.9)

## 2021-03-31 LAB — MAGNESIUM: Magnesium: 1.9 mg/dL (ref 1.7–2.4)

## 2021-03-31 LAB — MRSA NEXT GEN BY PCR, NASAL: MRSA by PCR Next Gen: NOT DETECTED

## 2021-03-31 LAB — SEDIMENTATION RATE: Sed Rate: 13 mm/hr (ref 0–16)

## 2021-03-31 LAB — HEMOGLOBIN A1C
Hgb A1c MFr Bld: 4.4 % — ABNORMAL LOW (ref 4.8–5.6)
Mean Plasma Glucose: 79.58 mg/dL

## 2021-03-31 LAB — PROTIME-INR
INR: 1 (ref 0.8–1.2)
Prothrombin Time: 13 seconds (ref 11.4–15.2)

## 2021-03-31 LAB — VITAMIN B12: Vitamin B-12: 231 pg/mL (ref 180–914)

## 2021-03-31 LAB — APTT: aPTT: 32 seconds (ref 24–36)

## 2021-03-31 LAB — HIV ANTIBODY (ROUTINE TESTING W REFLEX): HIV Screen 4th Generation wRfx: NONREACTIVE

## 2021-03-31 LAB — C-REACTIVE PROTEIN: CRP: <0.5 mg/dL (ref <1.0)

## 2021-03-31 LAB — TROPONIN I (HIGH SENSITIVITY): Troponin I (High Sensitivity): 3 ng/L (ref <18)

## 2021-03-31 LAB — PHOSPHORUS: Phosphorus: 3.5 mg/dL (ref 2.5–4.6)

## 2021-03-31 IMAGING — US US CAROTID DUPLEX BILAT
1 series · 13 of 24 positions shown · non-contrast
Comparison: None.

CLINICAL DATA: Syncopal episode. Previous myocardial infarction.
History of hypertension.

EXAM:
BILATERAL CAROTID DUPLEX ULTRASOUND
TECHNIQUE: Gray scale imaging, color Doppler and duplex ultrasound were
performed of bilateral carotid and vertebral arteries in the neck.

[Series 1: us carotid bilateral · 13 of 68 slices shown]
[im 1/68]
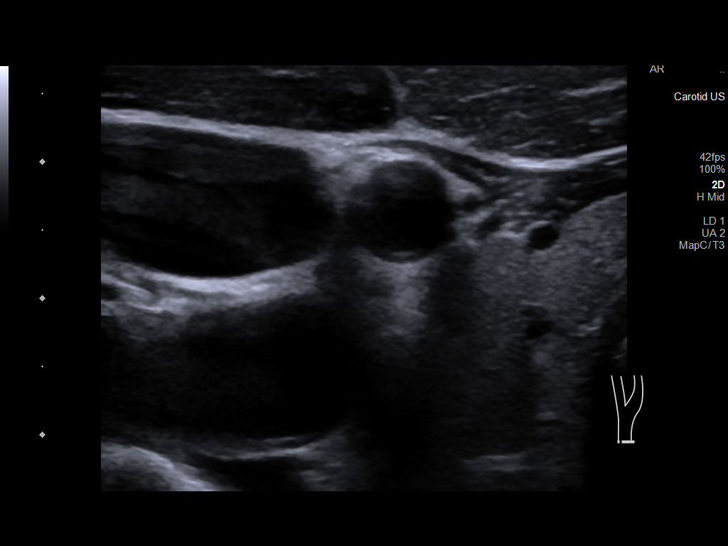
[im 6/68]
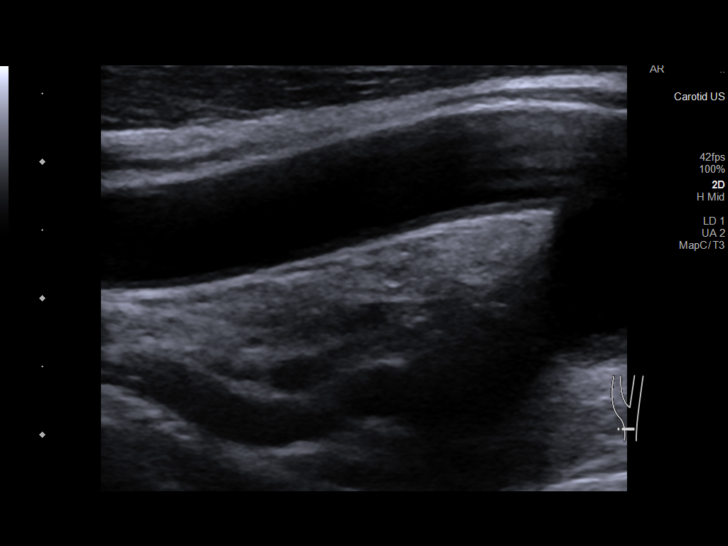
[im 12/68]
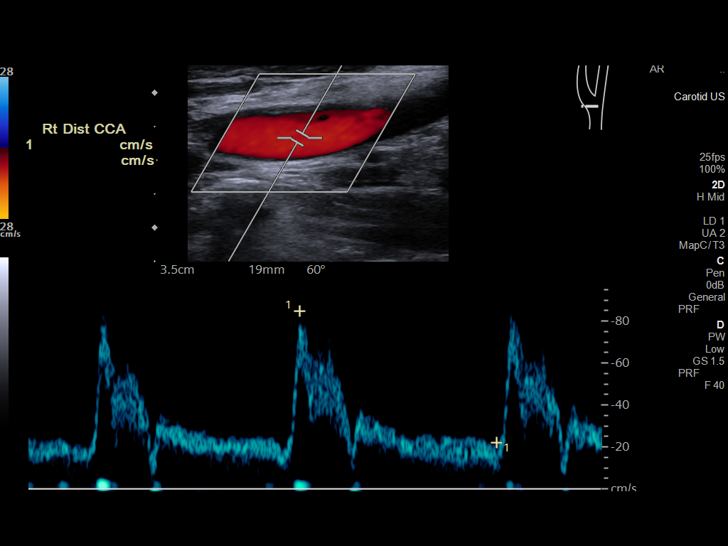
[im 18/68]
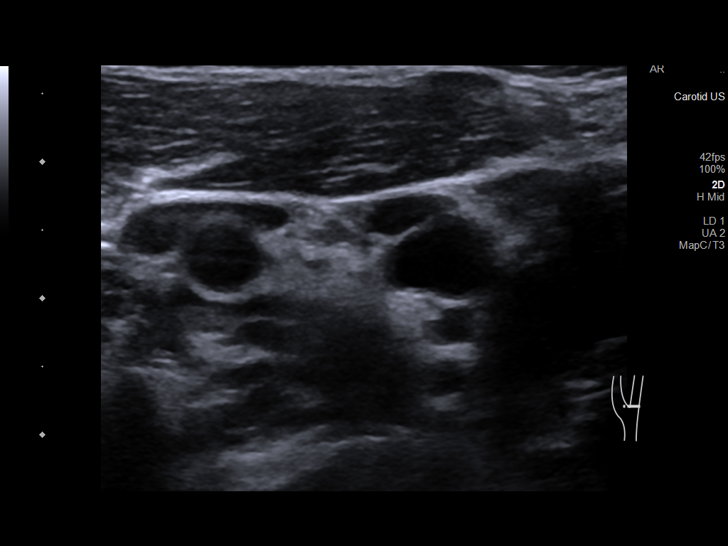
[im 24/68]
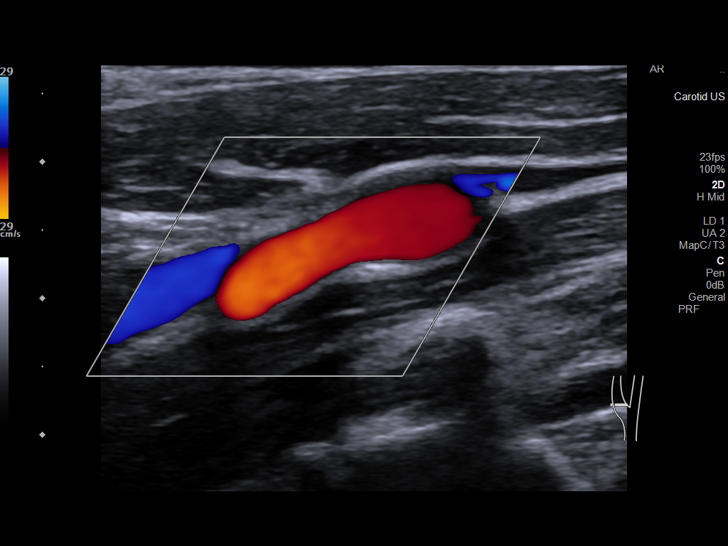
[im 30/68]
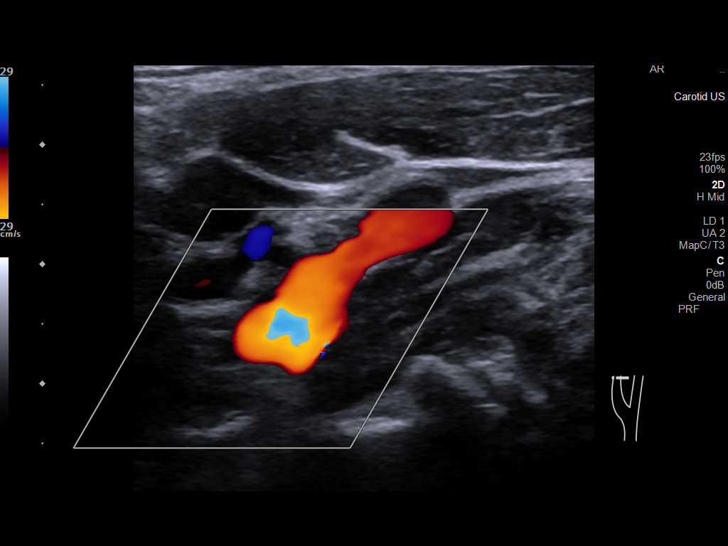
[im 35/68]
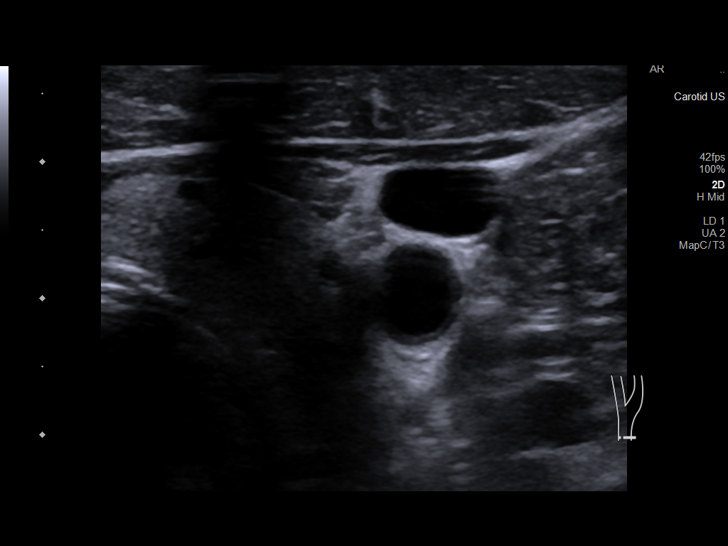
[im 38/68]
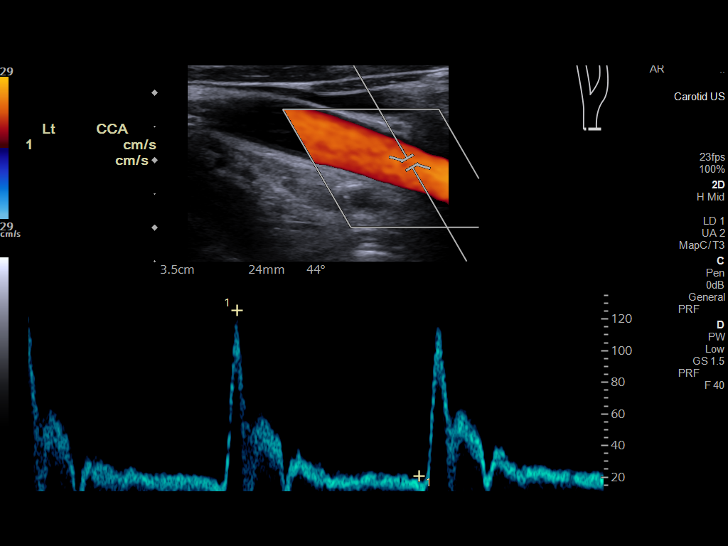
[im 44/68]
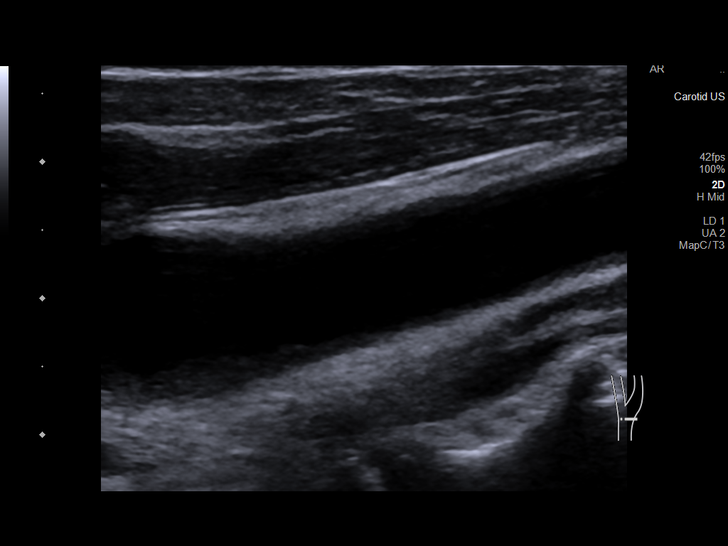
[im 50/68]
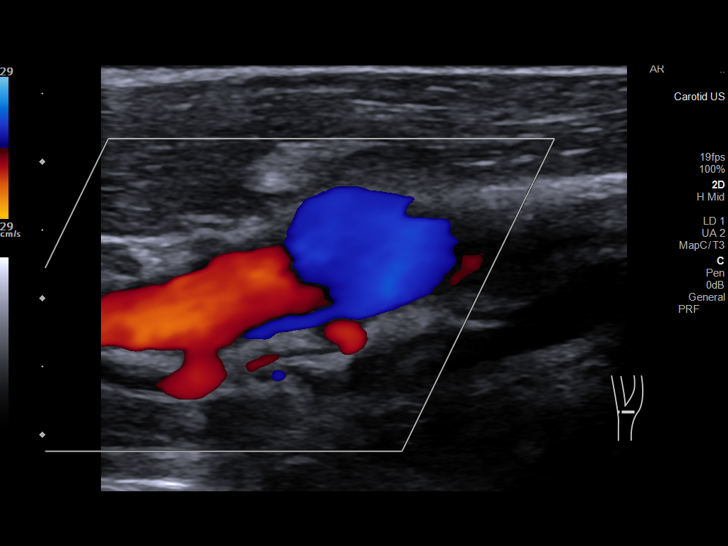
[im 56/68]
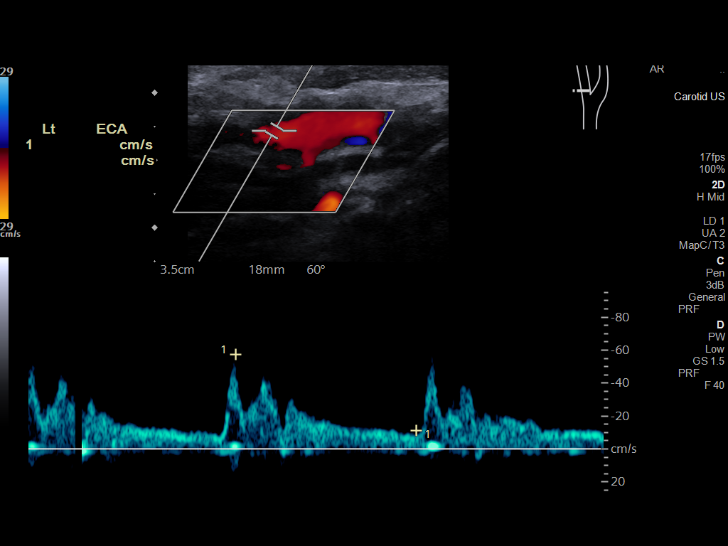
[im 62/68]
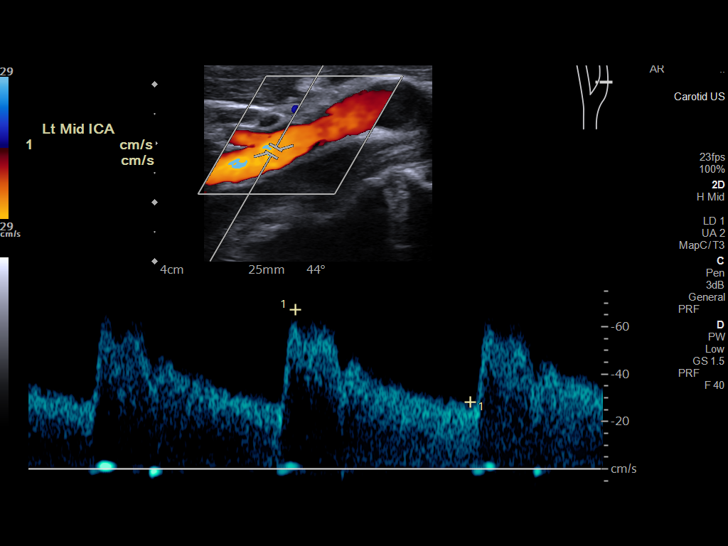
[im 68/68]
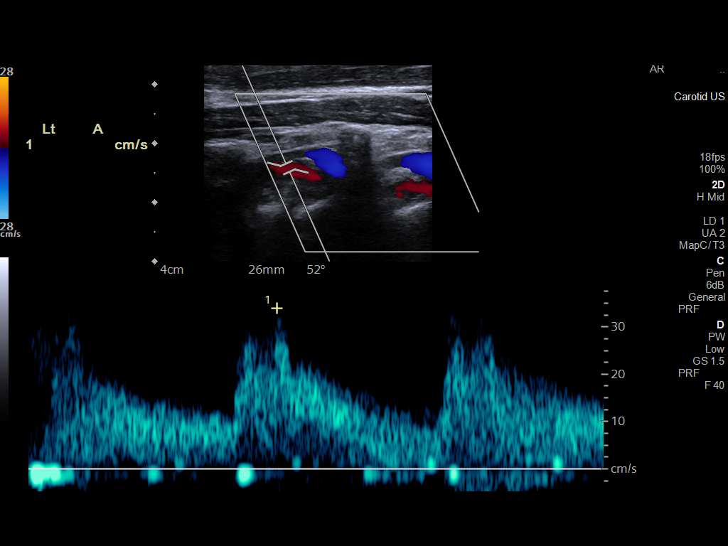

[13 of 24 positions shown; findings below may reference images not displayed]

FINDINGS: Criteria: Quantification of carotid stenosis is based on velocity
parameters that correlate the residual internal carotid diameter
with NASCET-based stenosis levels, using the diameter of the distal
internal carotid lumen as the denominator for stenosis measurement.

The following velocity measurements were obtained:

RIGHT

ICA: 112/40 cm/sec

CCA: 107/20 cm/sec

SYSTOLIC ICA/CCA RATIO:

ECA: 46 cm/sec

LEFT

ICA: 82/31 cm/sec

CCA: 98/22 cm/sec

SYSTOLIC ICA/CCA RATIO:

ECA: 57 cm/sec

RIGHT CAROTID ARTERY: There is a minimal amount of eccentric
echogenic plaque involving the mid aspect the right internal carotid
artery (image 27), not resulting in elevated peak systolic
velocities within the interrogated course of the right internal
carotid artery to suggest a hemodynamically significant stenosis.

RIGHT VERTEBRAL ARTERY:  Antegrade flow

LEFT CAROTID ARTERY: There is a minimal amount of intimal thickening
involving the left carotid bulb (images 48 and 50). There is a
minimal amount of eccentric echogenic plaque involving the origin
and proximal aspects of the left internal carotid artery (image 58),
not resulting in elevated peak systolic velocities within the
interrogated course of the left internal carotid artery to suggest a
hemodynamically significant stenosis.

LEFT VERTEBRAL ARTERY:  Antegrade flow
IMPRESSION: Minimal amount of bilateral atherosclerotic plaque, left greater
than right, not resulting in a hemodynamically significant stenosis
within either internal carotid artery.

## 2021-03-31 IMAGING — MR MR HEAD W/O CM
12 of 17 series · 35 of 48 positions shown · non-contrast
Comparison: None.

CLINICAL DATA: Acute neurologic deficit

EXAM:
MRI HEAD WITHOUT CONTRAST
TECHNIQUE: Multiplanar, multiecho pulse sequences of the brain and surrounding
structures were obtained without intravenous contrast.

[Series 5: DWI · axial · 3.0mm · 0.88mm/px · z∈[-96,+50]mm · 6 of 104 slices shown (1 of 4)]
[im 1/104]
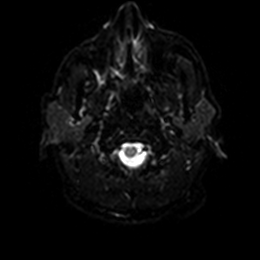
[im 21/104]
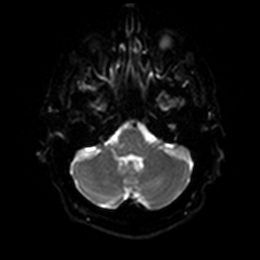
[im 42/104]
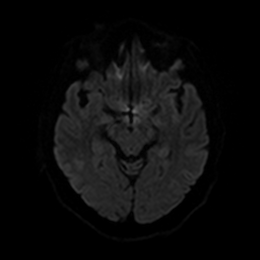
[im 62/104]
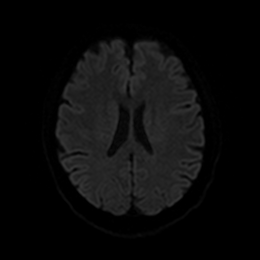
[im 83/104]
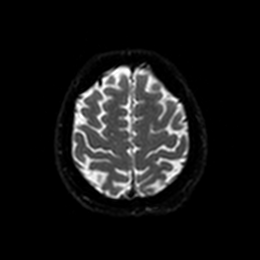
[im 104/104]
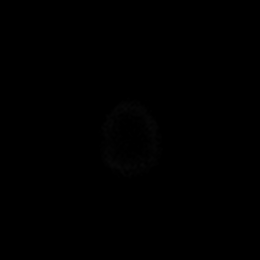

[Series 6: DWI · axial · 3.0mm · 0.88mm/px · z∈[-96,+50]mm · 3 of 52 slices shown (2 of 4)]
[im 1/52]
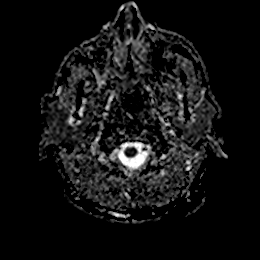
[im 26/52]
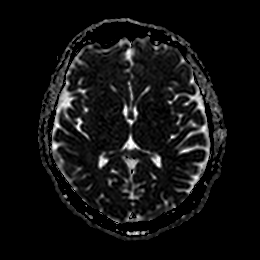
[im 52/52]
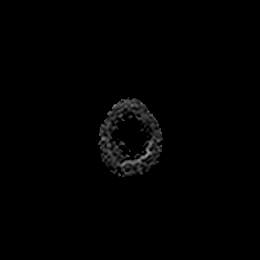

[Series 7: DWI · coronal · 4.0mm · 0.88mm/px · 4 of 68 slices shown (3 of 4)]
[im 1/68]
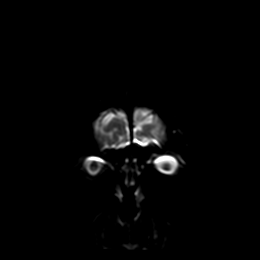
[im 23/68]
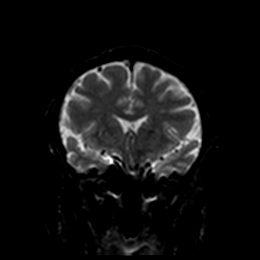
[im 45/68]
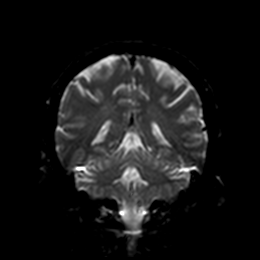
[im 68/68]
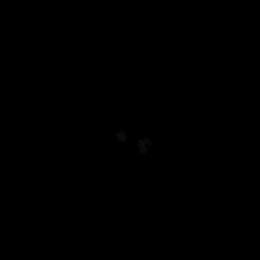

[Series 8: DWI · coronal · 4.0mm · 0.88mm/px · 2 of 34 slices shown (4 of 4)]
[im 1/34]
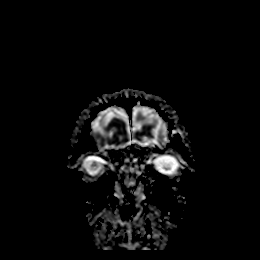
[im 34/34]
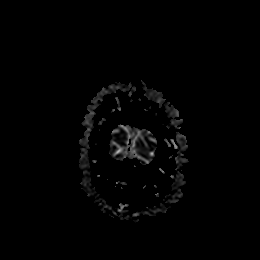

[Series 9: FLAIR · axial · 5.0mm · 0.45mm/px · z∈[-91,+53]mm · 2 of 26 slices shown]
[im 1/26]
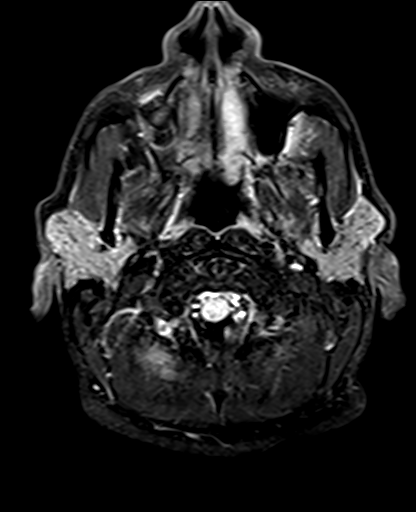
[im 26/26]
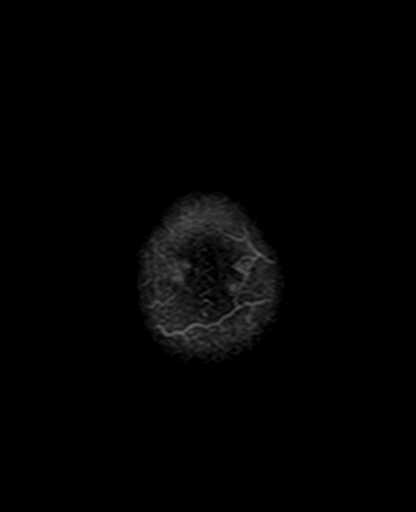

[Series 10: T1 · sagittal · 5.0mm · 0.75mm/px · 2 of 24 slices shown]
[im 1/24]
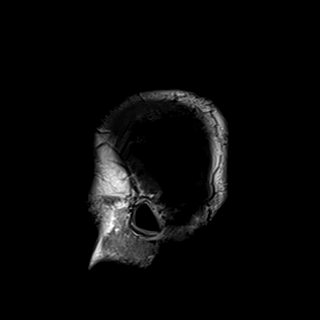
[im 24/24]
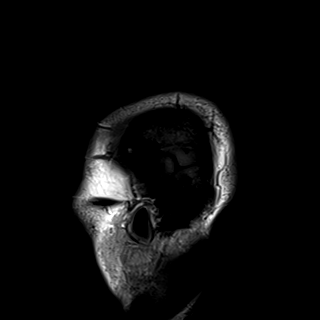

[Series 11: T2 · axial · 5.0mm · 0.72mm/px · z∈[-96,+47]mm · 2 of 26 slices shown (1 of 2)]
[im 1/26]
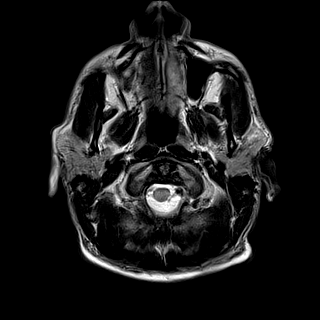
[im 26/26]
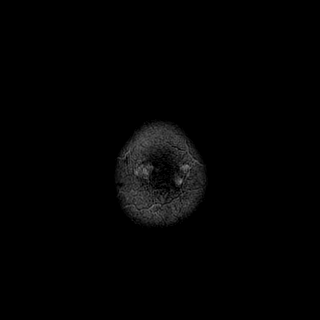

[Series 12: mag_images · axial · 3.0mm · 0.90mm/px · z∈[-94,+52]mm · 3 of 52 slices shown]
[im 1/52]
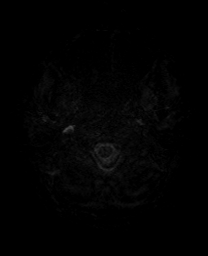
[im 26/52]
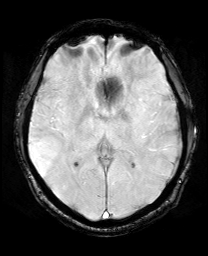
[im 52/52]
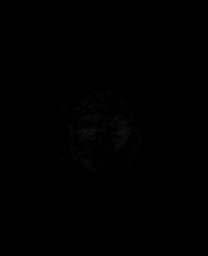

[Series 13: pha_images · axial · 3.0mm · 0.90mm/px · z∈[-94,+52]mm · 3 of 52 slices shown]
[im 1/52]
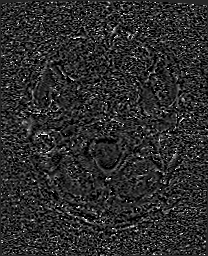
[im 26/52]
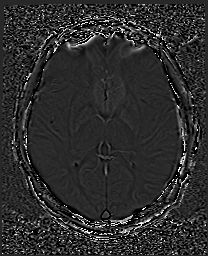
[im 52/52]
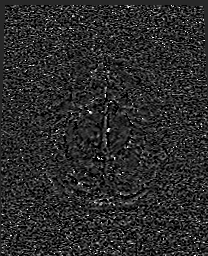

[Series 14: swi_images · axial · 3.0mm · 0.90mm/px · z∈[-94,+52]mm · 3 of 52 slices shown]
[im 1/52]
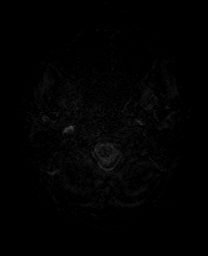
[im 26/52]
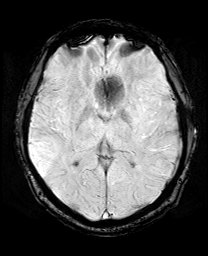
[im 52/52]
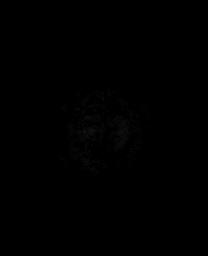

[Series 15: mip_images(sw) · axial · 24.0mm · 0.90mm/px · z∈[-84,+42]mm · 3 of 45 slices shown]
[im 1/45]
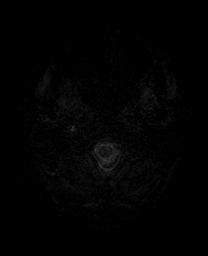
[im 23/45]
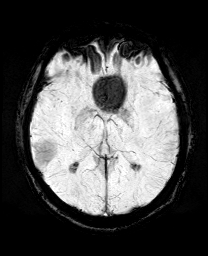
[im 45/45]
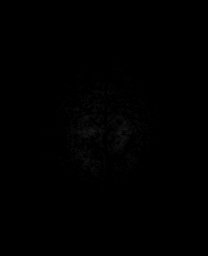

[Series 17: T2 · coronal · 5.0mm · 0.34mm/px · 2 of 29 slices shown (2 of 2)]
[im 1/29]
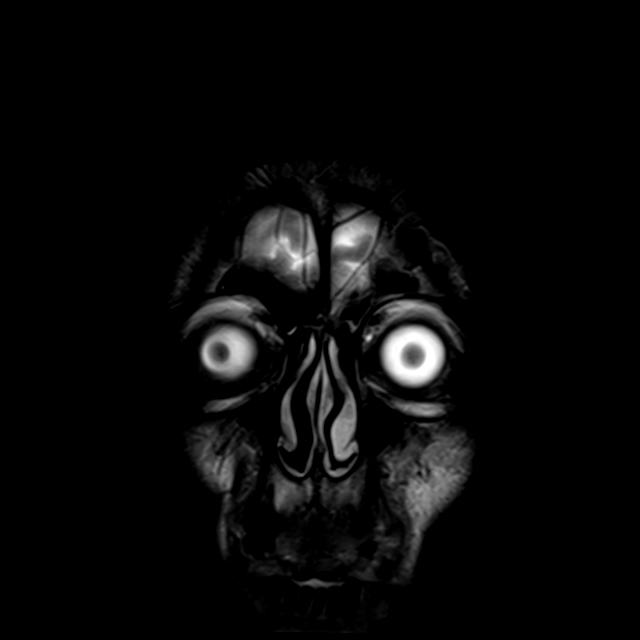
[im 29/29]
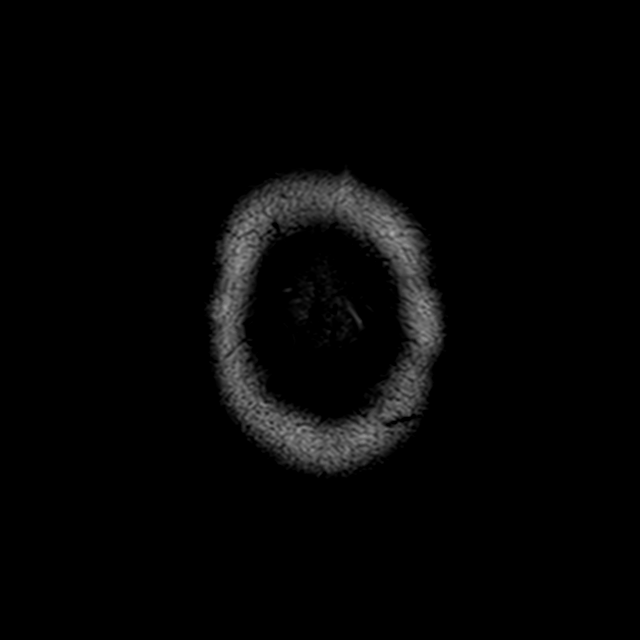

[35 of 48 positions shown; findings below may reference images not displayed]

FINDINGS: Brain: No acute infarct, mass effect or extra-axial collection. No
acute or chronic hemorrhage. Normal white matter signal, parenchymal
volume and CSF spaces. The midline structures are normal.

Vascular: Major flow voids are preserved.

Skull and upper cervical spine: Normal calvarium and skull base.
Visualized upper cervical spine and soft tissues are normal.

Sinuses/Orbits:No paranasal sinus fluid levels or advanced mucosal
thickening. No mastoid or middle ear effusion. Normal orbits.
IMPRESSION: Normal brain MRI.

## 2021-03-31 MED ORDER — ASPIRIN 325 MG PO TABS
325.0000 mg | ORAL_TABLET | Freq: Once | ORAL | Status: AC
  Administered 2021-03-31: 325 mg via ORAL
  Filled 2021-03-31: qty 1

## 2021-03-31 MED ORDER — ENOXAPARIN SODIUM 40 MG/0.4ML IJ SOSY: 40.0000 mg | PREFILLED_SYRINGE | INTRAMUSCULAR | Status: DC

## 2021-03-31 MED ORDER — ASPIRIN EC 81 MG PO TBEC
81.0000 mg | DELAYED_RELEASE_TABLET | Freq: Every day | ORAL | Status: DC
  Administered 2021-03-31 – 2021-04-01 (×2): 81 mg via ORAL
  Filled 2021-03-31 (×2): qty 1

## 2021-03-31 MED ORDER — LORAZEPAM 2 MG/ML IJ SOLN
0.5000 mg | Freq: Once | INTRAMUSCULAR | Status: AC | PRN
  Administered 2021-03-31: 0.5 mg via INTRAVENOUS
  Filled 2021-03-31: qty 1

## 2021-03-31 NOTE — Progress Notes (Signed)
SLP Cancellation Note  Patient Details Name: Marcus Dunn MRN: [id number] DOB: 11-17-1965   Cancelled treatment:       Reason Eval/Treat Not Completed: Patient at procedure or test/unavailable. SLP will attempt f/u next date.   Angela Nevin, MA, CCC-SLP Speech Therapy

## 2021-03-31 NOTE — Consult Note (Signed)
[facility] Tele[facility]urology Consult Services  Stat Consult  Date of Service:   03/31/2021 00:25:53  Diagnosis:       R55 - Syncope (blackout, fainting, vasovagal attack)       R20.2 - Paresthesia of skin  Impression Patient is a 55 yo RH male with a PMH of HTN, TIA who p/w syncopal event, preceded by 1-2 weeks of progressive left sided paresthesias and associated with transient CP and diaphoresis. On current exam his NIHSS is 3, subtle facial asymmetry (right palpebral fissure widened, possible subtle flattening on NLF), LLE drift and left sided sensory changes. CTH NAF. CTA head and neck negative for acute vascular findings. Differential includes stroke, demyelinating disease, seizure. Less likely vasovagal event, given associated symptoms.  CT HEAD: As Per Radiologist CT Head Showed No Acute Hemorrhage or Acute Core Infarct Reviewed  Advanced Imaging: CTA Head and Neck Completed.  LVO:No  Patient doesn't meet criteria for emergent NIR consideration   Our recommendations are outlined below.  Diagnostic Studies: Recommend MRI brain without contrast  Laboratory Studies: Recommend Lipid panel Hemoglobin A1c  Medications: Initiate Aspirin 81 mg daily Permissive hypertension, Antihypertensives with prn for first 24-48 hrs post stroke onset. If BP greater than 220/120 give Labetalol IV or Vasotec IV  Nursing Recommendations: Telemetry, IV Fluids, avoid dextrose containing fluids, Maintain euglycemia Neuro checks q4 hrs x 24 hrs and then per shift Head of bed 30 degrees  Consultations: Recommend Speech therapy if failed dysphagia screen Physical therapy/Occupational therapy  DVT Prophylaxis: Choice of Primary Team  Additional Recommendations: Consider routine EEG. Note, per d/w ED MD MRI is not available at this facility until Monday. I would recommend routine MRIs as noted above prior to clearance for discharge.  Disposition: Neurology will  follow  Diagnostic Studies Free Text: MRI cervical w/o- with right sided paresthesias and urinary frequency.  Free Text Medications: ASA 325mg  in the ED   Metrics: [facility] Notification Time: 03/31/2021 00:24:33 Stamp Time: 03/31/2021 00:25:53 Callback Response Time: 03/31/2021 00:27:56   ----------------------------------------------------------------------------------------------------  Chief Complaint: syncope, left sided numbness  History of Present Illness: Patient is a 55 year old Male.  Patient is a 55 yo RH male with a PMH of HTN, possible prior MI unclear who was brought in from his facility [facility] initially following a syncopal event this evening. Associated CP, shakiness and diaphoresis which have improved. While in the ED reported left sided numbness.  Patient reports new onset tingling of the left hand and foot beginning a week ago Monday, acute onset. No prior h/o paresthesias. Initially symptoms were intermittent, but have progressively increased. More constant tingling and numbness for several days, much more pronounced since the syncopal event. He reports he had been awake for an hour or so, he was sitting at down using his tablet. His suddenly felt unwell, his hands became clammy his left sided tingling increased and he had LOC. Limited information in regards to LOC event. No tongue bite or incontinence. He has noticed he has been urinating more frequently recently. He reports possible TIAs about 10 years ago. No preceding injury or trauma. He has had LOC in the past but cannot remember the circumstances. No current CP. No associated headache, focal weakness, vision or speech changes.   Anticoagulant use:  No  Antiplatelet use: No   Examination: BP(124//79), Pulse(60), Blood Glucose(91) 1A: Level of Consciousness - Alert; keenly responsive + 0 1B: Ask Month and Age - Both Questions Right + 0 1C: Blink Eyes & Squeeze Hands -  Performs Both  Tasks + 0 2: Test Horizontal Extraocular Movements - Normal + 0 3: Test Visual Fields - No Visual Loss + 0 4: Test Facial Palsy (Use Grimace if Obtunded) - Normal symmetry + 0 5A: Test Left Arm Motor Drift - No Drift for 10 Seconds + 0 5B: Test Right Arm Motor Drift - No Drift for 10 Seconds + 0 6A: Test Left Leg Motor Drift - No Drift for 5 Seconds + 0 6B: Test Right Leg Motor Drift - No Drift for 5 Seconds + 0 7: Test Limb Ataxia (FNF/Heel-Shin) - No Ataxia + 0 8: Test Sensation - Mild-Moderate Loss: Less Sharp/More Dull + 1 9: Test Language/Aphasia - Normal; No aphasia + 0 10: Test Dysarthria - Normal + 0 11: Test Extinction/Inattention - No abnormality + 0  NIHSS Score: 1     Patient / Family was informed the Neurology Consult would occur via TeleHealth consult by way of interactive audio and video telecommunications and consented to receiving care in this manner.  Patient is being evaluated for possible acute neurologic impairment and high probability of imminent or life - threatening deterioration.I spent total of 25 minutes providing care to this patient, including time for face to face visit via telemedicine, review of medical records, imaging studies and discussion of findings with providers, the patient and / or family.   Dr Jennefer Bravo   [facility] [phone]  Case [id number]

## 2021-03-31 NOTE — Plan of Care (Signed)
Reviewed EKG. Unfortunately no baseline. Sinus rhythm with LVH. Diffuse concave St segment elevations with St depression in AVR favors pericarditis picture. With atypical St segments, diffuse elevations and no reciprocal changes this is not consistent with a STEMI. Consider obtaining an ESR/CRP and treating empirically for pericarditis with NSAIDS/colchicine/PPI if patient symptoms fit this diagnosis

## 2021-03-31 NOTE — Progress Notes (Signed)
Marcus Dunn is a 55 y.o. male with medical history significant for hypertension who presents to the emergency department via EMS from [facility] prison.  Patient complained of sudden onset of pressure-like chest pain which was associated with diaphoresis while sitting down with these tablets in the prison, he states that he passed out shortly after this.  He was noted to have similar chest pain back in 2017 and was told that he needed cardiac stents, but had never received this.  He has also been noted to have some left-sided numbness and tingling that has been ongoing over the last 1-2 weeks preceding the syncope.  He has been seen and evaluated at bedside and has been admitted after midnight.  He is currently being evaluated for possibility of pericarditis, but denies any further chest pain at this time and therefore this appears unlikely.  ESR and CRP have been ordered as well as 2D echocardiogram and troponins have remained flat.  Further work-up with brain MRI per neurology recommendations have been ordered, but he will require transfer to obtain further imaging as well as neurology evaluation after receiving the studies.  He denies any significant complaints or concerns at this time and is currently hungry and awaiting breakfast.  A.m. labs have been ordered as well as appropriate imaging studies per teleneurology recommendations.  Total care time: 30 minutes.

## 2021-03-31 NOTE — Evaluation (Addendum)
Physical Therapy Evaluation Patient Details Name: Marcus Dunn MRN: [id number] DOB: Jul 16, 1965 Today's Date: 03/31/2021  History of Present Illness  The pt is a 55 yo male presenting 10/7 with c/o chest pain following syncopal event initially, once in ED also reporting L-sided tingling/numbness. Continued workup by neurology, MRI pending. PMH includes: HTN, MI, and knee surgery.   Clinical Impression  Pt in bed upon arrival of PT, agreeable to evaluation at this time. Prior to admission the pt was completely independent without need for AD, working Holiday representative 4 days/week. The pt now presents with minor limitations in functional strength, coordination, and dynamic stability due to above dx, and will continue to benefit from skilled PT to address these deficits. The pt was able to demo good independence with bed mobility and initial transfers without UE support, and was able to progress to hallway ambulation without UE support even with balance challenges. The pt will continue to benefit from skilled PT acutely, but will likely be safe to return to facility when medically stable without continued follow up therapies.     Dynamic Gait Index (DGI): 20/24 (<19 indicates increased risk for falls)    Recommendations for follow up therapy are one component of a multi-disciplinary discharge planning process, led by the attending physician.  Recommendations may be updated based on patient status, additional functional criteria and insurance authorization.  Follow Up Recommendations No PT follow up;Supervision for mobility/OOB    Equipment Recommendations  None recommended by PT    Recommendations for Other Services       Precautions / Restrictions Precautions Precautions: Fall Restrictions Weight Bearing Restrictions: No      Mobility  Bed Mobility Overal bed mobility: Independent                  Transfers Overall transfer level: Needs assistance Equipment used:  None Transfers: Sit to/from Stand Sit to Stand: Supervision         General transfer comment: no assist needed, good stability  Ambulation/Gait Ambulation/Gait assistance: Min guard Gait Distance (Feet): 200 Feet Assistive device: Rolling walker (2 wheeled);None Gait Pattern/deviations: WFL(Within Functional Limits) Gait velocity: WFL   General Gait Details: no LOB, buckling, or instability, even with balance challenges. initially with RW, but pt with good stability so we tried without UE support and he was able to complete with good stability and no UE support      Modified Rankin (Stroke Patients Only) Modified Rankin (Stroke Patients Only) Pre-Morbid Rankin Score: No symptoms Modified Rankin: Moderately severe disability     Balance Overall balance assessment: Needs assistance Sitting-balance support: No upper extremity supported;Feet supported Sitting balance-Leahy Scale: Good     Standing balance support: No upper extremity supported;During functional activity Standing balance-Leahy Scale: Good Standing balance comment: no UE support, minG for safety             High level balance activites: Backward walking;Turns;Direction changes;Head turns;Other (comment) (tandem walking) High Level Balance Comments: no LOB Standardized Balance Assessment Standardized Balance Assessment : Dynamic Gait Index   Dynamic Gait Index Level Surface: Normal Change in Gait Speed: Normal Gait with Horizontal Head Turns: Mild Impairment Gait with Vertical Head Turns: Mild Impairment Gait and Pivot Turn: Normal Step Over Obstacle: Mild Impairment Step Around Obstacles: Normal Steps: Mild Impairment Total Score: 20       Pertinent Vitals/Pain Pain Assessment: No/denies pain    Home Living Family/patient expects to be discharged to:: [facility]  Prior Function Level of Independence: Independent         Comments: working 4 days/week  doing Psychologist, counselling   Dominant Hand: Right    Extremity/Trunk Assessment   Upper Extremity Assessment Upper Extremity Assessment: Defer to OT evaluation;LUE deficits/detail LUE Deficits / Details: pt able to maintain good force production against mod resistance, grossly 4/5 to MMT. pt reports decreased sensation throughout, most significant difference distally LUE Sensation: decreased light touch LUE Coordination: decreased fine motor    Lower Extremity Assessment Lower Extremity Assessment: LLE deficits/detail LLE Deficits / Details: pt able to maintain good force production against mod resistance, grossly 4/5 to MMT. pt reports decreased sensation throughout, most significant difference distally. no instances of foot drop or knee bukcling with gait LLE Sensation: decreased light touch LLE Coordination: decreased fine motor    Cervical / Trunk Assessment Cervical / Trunk Assessment: Normal  Communication   Communication: No difficulties  Cognition Arousal/Alertness: Awake/alert Behavior During Therapy: WFL for tasks assessed/performed;Flat affect Overall Cognitive Status: Within Functional Limits for tasks assessed                                        General Comments General comments (skin integrity, edema, etc.): VSS on RA    Exercises     Assessment/Plan    PT Assessment Patient needs continued PT services  PT Problem List Decreased strength;Decreased activity tolerance;Decreased balance;Decreased mobility;Decreased coordination;Decreased cognition;Decreased safety awareness       PT Treatment Interventions DME instruction;Gait training;Stair training;Functional mobility training;Therapeutic activities;Therapeutic exercise;Neuromuscular re-education;Balance training;Patient/family education    PT Goals (Current goals can be found in the Care Plan section)  Acute Rehab PT Goals Patient Stated Goal: get back to work PT Goal  Formulation: With patient Time For Goal Achievement: 04/14/21 Potential to Achieve Goals: Good    Frequency Min 4X/week    AM-PAC PT "6 Clicks" Mobility  Outcome Measure Help needed turning from your back to your side while in a flat bed without using bedrails?: None Help needed moving from lying on your back to sitting on the side of a flat bed without using bedrails?: None Help needed moving to and from a bed to a chair (including a wheelchair)?: A Little Help needed standing up from a chair using your arms (e.g., wheelchair or bedside chair)?: A Little Help needed to walk in hospital room?: A Little Help needed climbing 3-5 steps with a railing? : A Little 6 Click Score: 20    End of Session Equipment Utilized During Treatment: Gait belt Activity Tolerance: Patient tolerated treatment well Patient left: in bed;with call bell/phone within reach;with nursing/sitter in room;with bed alarm set Nurse Communication: Mobility status PT Visit Diagnosis: Other abnormalities of gait and mobility (R26.89);Muscle weakness (generalized) (M62.81)    Time: 4081-4481 PT Time Calculation (min) (ACUTE ONLY): 21 min   Charges:   PT Evaluation $PT Eval Moderate Complexity: 1 Mod          Vickki Muff, PT, DPT   Acute Rehabilitation Department Pager #: [phone]  Ronnie Derby 03/31/2021, 4:47 PM

## 2021-03-31 NOTE — Progress Notes (Signed)
*  PRELIMINARY RESULTS* Echocardiogram 2D Echocardiogram has been performed.  Marcus Dunn 03/31/2021, 2:34 PM

## 2021-03-31 NOTE — H&P (Signed)
History and Physical  Marcus Dunn LKG:[id number] DOB: [birth year] DOA: 03/30/2021  Referring physician: Joseph Berkshire, MD PCP: Pcp, No  Patient coming from: [facility]  Chief Complaint: Chest pain  HPI: Marcus Dunn is a 55 y.o. male with medical history significant for hypertension who presents to the emergency department via EMS from [facility] prison.  Patient complained of sudden onset of pressure-like chest pain which was associated with diaphoresis while sitting down with these tablets in the prison, he states that he passed out shortly after this. On recovery, he continued to have chest pain which eventually resolved en route to the hospital.  Patient states that he was in a hospital at [facility] in 2017 due to presenting with similar chest pain and at that time, he was told that he needed a stent in his heart, but due to being released sooner from the prison, he was unable to have the procedure and patient did not follow-up for the procedure. Regarding left-sided numbness and tingling, this has been ongoing for about 2 weeks, but this became more recurrent within last few days.  ED Course:  In the emergency department, he was bradycardic, but other vital signs were within normal range.  Work-up in the ED showed normal CBC except for elevated MCV at 104.8, CMP was normal except for elevated creatinine at 1.42.  Troponin x2 was flat at 3, urinalysis was unimpressive for UTI. CT angiography of head and neck showed no LVO, hemodynamically significant stenosis or other acute vascular abnormality. CT of head without contrast showed no acute intracranial abnormality Chest x-ray showed no active cardiopulmonary disease IV hydration was provided.  Teleneurology was consulted and recommended further stroke work-up.  Hospitalist was asked to admit patient for further evaluation and management.  Review of Systems: Constitutional: Negative for chills and fever.  HENT: Negative for  ear pain and sore throat.   Eyes: Negative for pain and visual disturbance.  Respiratory: Negative for cough, chest tightness and shortness of breath.   Cardiovascular: Positive for chest pain and negative for palpitations.  Gastrointestinal: Negative for abdominal pain and vomiting.  Endocrine: Negative for polyphagia and polyuria.  Genitourinary: Negative for decreased urine volume, dysuria, enuresis Musculoskeletal: Negative for arthralgias and back pain.  Skin: Negative for color change and rash.  Allergic/Immunologic: Negative for immunocompromised state.  Neurological: Positive for syncope.  Negative for tremors, speech difficulty, weakness, light-headedness and headaches.  Hematological: Does not bruise/bleed easily.  All other systems reviewed and are negative   Past Medical History:  Diagnosis Date   Hypertension    MI (myocardial infarction) ([place])    Past Surgical History:  Procedure Laterality Date   HERNIA REPAIR     KNEE SURGERY     SHOULDER SURGERY      Social History:  reports that he has never smoked. He has never used smokeless tobacco. He reports that he does not currently use alcohol. He reports that he does not currently use drugs.   Allergies  Allergen Reactions   Chocolate Rash   Hydrochlorothiazide Rash   Lisinopril Rash   Penicillins Rash    No family history on file.    Prior to Admission medications   Not on File    Physical Exam: BP (!) 128/91   Pulse (!) 53   Temp 98.4 F (36.9 C) (Oral)   Resp 14   SpO2 99%   General: 55 y.o. year-old male well developed well nourished in no acute distress.  Alert and  oriented x3. HEENT: NCAT, EOMI Neck: Supple, trachea medial Cardiovascular: Regular rate and rhythm with no rubs or gallops.  No thyromegaly or JVD noted.  No lower extremity edema. 2/4 pulses in all 4 extremities. Respiratory: Clear to auscultation with no wheezes or rales. Good inspiratory effort. Abdomen: Soft, nontender  nondistended with normal bowel sounds x4 quadrants. Muskuloskeletal: No cyanosis, clubbing or edema noted bilaterally Neuro: CN II-XII intact, strength 5/5 x 4, sensation, reflexes intact Skin: No ulcerative lesions noted or rashes Psychiatry: Judgement and insight appear normal. Mood is appropriate for condition and setting          Labs on Admission:  Basic Metabolic Panel: Recent Labs  Lab 03/30/21 2110  NA 137  K 4.0  CL 103  CO2 28  GLUCOSE 91  BUN 18  CREATININE 1.42*  CALCIUM 9.4   Liver Function Tests: Recent Labs  Lab 03/30/21 2110  AST 15  ALT 14  ALKPHOS 105  BILITOT 0.9  PROT 7.6  ALBUMIN 4.1   No results for input(s): LIPASE, AMYLASE in the last 168 hours. No results for input(s): AMMONIA in the last 168 hours. CBC: Recent Labs  Lab 03/30/21 2110  WBC 4.0  NEUTROABS 1.6*  HGB 13.6  HCT 41.1  MCV 104.8*  PLT 190   Cardiac Enzymes: No results for input(s): CKTOTAL, CKMB, CKMBINDEX, TROPONINI in the last 168 hours.  BNP (last 3 results) No results for input(s): BNP in the last 8760 hours.  ProBNP (last 3 results) No results for input(s): PROBNP in the last 8760 hours.  CBG: Recent Labs  Lab 03/30/21 2200  GLUCAP 91    Radiological Exams on Admission: CT ANGIO HEAD NECK W WO CM  Result Date: 03/30/2021 CLINICAL DATA:  Initial evaluation for neuro deficit, stroke suspected. EXAM: CT ANGIOGRAPHY HEAD AND NECK TECHNIQUE: Multidetector CT imaging of the head and neck was performed using the standard protocol during bolus administration of intravenous contrast. Multiplanar CT image reconstructions and MIPs were obtained to evaluate the vascular anatomy. Carotid stenosis measurements (when applicable) are obtained utilizing NASCET criteria, using the distal internal carotid diameter as the denominator. CONTRAST:  40mL OMNIPAQUE IOHEXOL 350 MG/ML SOLN COMPARISON:  Prior head CT from earlier the same day. FINDINGS: CTA NECK FINDINGS Aortic arch:  Visualized aortic arch normal in caliber with normal 3 vessel morphology. No stenosis or other abnormality about the origin of the great vessels. Right carotid system: Right common and internal carotid arteries widely patent without stenosis, dissection or occlusion. Left carotid system: Left common and internal carotid arteries patent without stenosis, dissection or occlusion. Mild atheromatous change about the origin of the cervical left ICA without stenosis. Vertebral arteries: Both vertebral arteries arise from the subclavian arteries. No proximal subclavian artery stenosis. Both vertebral arteries widely patent without stenosis, dissection or occlusion. Skeleton: No discrete or worrisome osseous lesions. Ordinary for age multilevel cervical spondylosis. Other neck: No other acute soft tissue abnormality within the neck. Upper chest: Visualized upper chest demonstrates no acute finding. Review of the MIP images confirms the above findings CTA HEAD FINDINGS Anterior circulation: Both internal carotid arteries widely patent to the termini without stenosis. A1 segments widely patent. Normal anterior communicating artery complex. Both anterior cerebral arteries widely patent to their distal aspects without stenosis. No M1 stenosis or occlusion. Normal MCA bifurcations. Distal MCA branches well perfused and symmetric. Posterior circulation: Both V4 segments patent to the vertebrobasilar junction without stenosis. Both PICA origins patent and normal. Basilar widely patent to its  distal aspect without stenosis. Superior cerebellar arteries patent bilaterally. Both PCAs primarily supplied via the basilar and are well perfused to there distal aspects. Venous sinuses: Patent allowing for timing the contrast bolus. Anatomic variants: None significant. Review of the MIP images confirms the above findings IMPRESSION: Negative CTA of the head and neck. No large vessel occlusion, hemodynamically significant stenosis, or other  acute vascular abnormality. Electronically Signed   By: Jeannine Boga M.D.   On: 03/30/2021 23:47   DG Chest 2 View  Result Date: 03/30/2021 CLINICAL DATA:  Syncope EXAM: CHEST - 2 VIEW COMPARISON:  10/16/2003 chest radiograph. FINDINGS: Stable cardiomediastinal silhouette with normal heart size. No pneumothorax. No pleural effusion. Lungs appear clear, with no acute consolidative airspace disease and no pulmonary edema. IMPRESSION: No active cardiopulmonary disease. Electronically Signed   By: Ilona Sorrel M.D.   On: 03/30/2021 21:53   CT HEAD WO CONTRAST  Result Date: 03/30/2021 CLINICAL DATA:  Syncopal episode EXAM: CT HEAD WITHOUT CONTRAST TECHNIQUE: Contiguous axial images were obtained from the base of the skull through the vertex without intravenous contrast. COMPARISON:  None. FINDINGS: Brain: No acute territorial infarction, hemorrhage or intracranial mass. The ventricles are nonenlarged Vascular: No hyperdense vessel or unexpected calcification. Skull: Normal. Negative for fracture or focal lesion. Sinuses/Orbits: No acute finding. Other: None IMPRESSION: Negative non contrasted CT appearance of the brain Electronically Signed   By: Donavan Foil M.D.   On: 03/30/2021 21:46    EKG: I independently viewed the EKG done and my findings are as followed: Sinus bradycardia with a heart rate of 59 bpm and ST elevation in leads II, III and aVF and V3-V6.  Assessment/Plan Present on Admission:  Syncope  Active Problems:   Syncope   Numbness and tingling of left arm and leg   Chest pain   Essential hypertension   Acute syncopal episode Continue telemetry and watch for arrhythmias Troponins flat at 3  EKG showed Sinus bradycardia with a heart rate of 59 bpm and ST elevation in leads II, III and aVF and V3-V6. Echocardiogram will be done to rule out significant aortic stenosis or other outflow obstruction, and also to evaluate EF and to rule out segmental/Regional wall motion  abnormalities.  Carotid artery Dopplers will be done to rule out hemodynamically significant stenosis  Chest pain- resolved Troponin x2 was flat at 3 EKG showed ST elevation in several different leads- ?? pericarditis ESR and CRP will be checked  Numbness and tingling of left arm and leg, rule out acute ischemic stroke Patient will be admitted to telemetry unit  CT of head showed no acute intracranial abnormality CT angiography of neck showed no LVO Echocardiogram in the morning MRI of brain without contrast in the morning Continue aspirin 81 mg p.o. daily and consider starting statin Continue fall precautions and neuro checks Lipid panel and hemoglobin A1c will be checked Continue PT/OT/SLP eval and treat Bedside swallow eval by nursing prior to diet Neurology will be consulted and we shall await further recommendation  Elevated MCV (104.8) Folate level and B12 will be checked  Essential hypertension (controlled) Permissive hypertension will be allowed at this time  DVT prophylaxis: SCDs  Code Status: Full code  Family Communication: None at bedside  Disposition Plan:  Patient is from:                        home Anticipated DC to:  SNF or family members home Anticipated DC date:               2-3 days Anticipated DC barriers:          Patient requires inpatient management due to syncopal episode and  Consults called: Neurology  Admission status: Inpatient    Bernadette Hoit MD [facility]  03/31/2021, 4:01 AM

## 2021-03-31 NOTE — ED Provider Notes (Signed)
Patient seen initially by Dr. Particia Nearing with left-sided numbness and tingling.  Patient was signed out to me with CT angiography pending.  This has been performed and reviewed, no acute findings.  Teleneurology consultation has been obtained.  Neurology feels that there is some slight weakness and facial asymmetry on exam, recommends admission for MRI of brain, MRI of cervical spine.  Both can be performed without contrast.  These are not emergent, does not require emergent transfer for imaging at this time but does require hospitalization for further work-up.   Gilda Crease, MD 03/31/21 [phone]

## 2021-04-01 LAB — CBC
HCT: 39.1 % (ref 39.0–52.0)
Hemoglobin: 13.2 g/dL (ref 13.0–17.0)
MCH: 34.3 pg — ABNORMAL HIGH (ref 26.0–34.0)
MCHC: 33.8 g/dL (ref 30.0–36.0)
MCV: 101.6 fL — ABNORMAL HIGH (ref 80.0–100.0)
Platelets: 189 10*3/uL (ref 150–400)
RBC: 3.85 MIL/uL — ABNORMAL LOW (ref 4.22–5.81)
RDW: 11.3 % — ABNORMAL LOW (ref 11.5–15.5)
WBC: 3.7 10*3/uL — ABNORMAL LOW (ref 4.0–10.5)
nRBC: 0 % (ref 0.0–0.2)

## 2021-04-01 LAB — BASIC METABOLIC PANEL
Anion gap: 7 (ref 5–15)
BUN: 12 mg/dL (ref 6–20)
CO2: 25 mmol/L (ref 22–32)
Calcium: 8.8 mg/dL — ABNORMAL LOW (ref 8.9–10.3)
Chloride: 105 mmol/L (ref 98–111)
Creatinine, Ser: 1.24 mg/dL (ref 0.61–1.24)
GFR, Estimated: >60 mL/min (ref >60)
Glucose, Bld: 94 mg/dL (ref 70–99)
Potassium: 3.8 mmol/L (ref 3.5–5.1)
Sodium: 137 mmol/L (ref 135–145)

## 2021-04-01 LAB — MAGNESIUM: Magnesium: 1.9 mg/dL (ref 1.7–2.4)

## 2021-04-01 MED ORDER — ACETAMINOPHEN 325 MG PO TABS
650.0000 mg | ORAL_TABLET | Freq: Four times a day (QID) | ORAL | Status: DC | PRN
  Administered 2021-04-01: 650 mg via ORAL
  Filled 2021-04-01: qty 2

## 2021-04-01 MED ORDER — ATORVASTATIN CALCIUM 40 MG PO TABS
40.0000 mg | ORAL_TABLET | Freq: Every day | ORAL | Status: DC
  Administered 2021-04-01: 40 mg via ORAL
  Filled 2021-04-01: qty 1

## 2021-04-01 MED ORDER — CYANOCOBALAMIN 1000 MCG/ML IJ SOLN
1000.0000 ug | Freq: Once | INTRAMUSCULAR | Status: AC
  Administered 2021-04-01: 1000 ug via INTRAMUSCULAR
  Filled 2021-04-01 (×2): qty 1

## 2021-04-01 MED ORDER — KETOROLAC TROMETHAMINE 15 MG/ML IJ SOLN
15.0000 mg | Freq: Once | INTRAMUSCULAR | Status: AC
  Administered 2021-04-01: 15 mg via INTRAVENOUS
  Filled 2021-04-01: qty 1

## 2021-04-01 MED ORDER — ACETAMINOPHEN 325 MG PO TABS
650.0000 mg | ORAL_TABLET | Freq: Four times a day (QID) | ORAL | Status: DC | PRN
Start: 2021-04-01 — End: 2021-05-14

## 2021-04-01 MED ORDER — CYANOCOBALAMIN 500 MCG PO TABS: 500.0000 ug | ORAL_TABLET | Freq: Every day | ORAL | Status: AC

## 2021-04-01 MED ORDER — IBUPROFEN 200 MG PO TABS: 200.0000 mg | ORAL_TABLET | Freq: Three times a day (TID) | ORAL | 0 refills | Status: AC | PRN

## 2021-04-01 NOTE — Plan of Care (Signed)
  Problem: Acute Rehab PT Goals(only PT should resolve) Goal: Patient Will Transfer Sit To/From Stand Outcome: Completed/Met Goal: Pt Will Ambulate Outcome: Completed/Met Goal: Pt Will Go Up/Down Stairs Outcome: Completed/Met Goal: Pt/caregiver will Perform Home Exercise Program Outcome: Completed/Met   Problem: Education: Goal: Knowledge of General Education information will improve Description: Including pain rating scale, medication(s)/side effects and non-pharmacologic comfort measures Outcome: Completed/Met   Problem: Health Behavior/Discharge Planning: Goal: Ability to manage health-related needs will improve Outcome: Completed/Met   Problem: Clinical Measurements: Goal: Ability to maintain clinical measurements within normal limits will improve Outcome: Completed/Met Goal: Will remain free from infection Outcome: Completed/Met Goal: Diagnostic test results will improve Outcome: Completed/Met Goal: Respiratory complications will improve Outcome: Completed/Met Goal: Cardiovascular complication will be avoided Outcome: Completed/Met   Problem: Activity: Goal: Risk for activity intolerance will decrease Outcome: Completed/Met   Problem: Nutrition: Goal: Adequate nutrition will be maintained Outcome: Completed/Met   Problem: Coping: Goal: Level of anxiety will decrease Outcome: Completed/Met   Problem: Elimination: Goal: Will not experience complications related to bowel motility Outcome: Completed/Met Goal: Will not experience complications related to urinary retention Outcome: Completed/Met   Problem: Pain Managment: Goal: General experience of comfort will improve Outcome: Completed/Met   Problem: Safety: Goal: Ability to remain free from injury will improve Outcome: Completed/Met   Problem: Skin Integrity: Goal: Risk for impaired skin integrity will decrease Outcome: Completed/Met

## 2021-04-01 NOTE — Discharge Summary (Addendum)
Physician Discharge Summary  Marcus Dunn ZOX:[id number] DOB: 06/01/1966 DOA: 03/30/2021  PCP: Oneita Hurt, No  AdmiNAWAF STRANGE/2022 Discharge date: 04/01/2021  Admitted From: [facility] Disposition:  [facility]  Recommendations for Outpatient Follow-up:  Follow up with PCP in 1-2 weeks Please recheck B12 level in 6 weeks   Home Health:NO Equipment/Devices:No  Discharge Condition:Stable CODE STATUS:FULL Diet recommendation: Heart Healthy   Brief/Interim Summary:  Marcus Dunn is a 55 y.o. male with medical history significant for hypertension who presents to the emergency department via EMS from [facility] prison.  Patient complained of sudden onset of pressure-like chest pain which was associated with diaphoresis while sitting down with these tablets in the prison, he states that he passed out shortly after this. On recovery, he continued to have chest pain which eventually resolved en route to the hospital.  Patient states that he was in a hospital at [facility] in 2017 due to presenting with similar chest pain and at that time, he was told that he needed a stent in his heart, but due to being released sooner from the prison, he was unable to have the procedure and patient did not follow-up for the procedure. Regarding left-sided numbness and tingling, this has been ongoing for about 2 weeks, but this became more recurrent within last few days.    ED Course:  In the emergency department, he was bradycardic, but other vital signs were within normal range.  Work-up in the ED showed normal CBC except for elevated MCV at 104.8, CMP was normal except for elevated creatinine at 1.42.  Troponin x2 was flat at 3, urinalysis was unimpressive for UTI. CT angiography of head and neck showed no LVO, hemodynamically significant stenosis or other acute vascular abnormality. CT of head without contrast showed no acute intracranial abnormality Chest x-ray showed no active cardiopulmonary  disease IV hydration was provided.  Teleneurology was consulted and recommended further stroke work-up.  Hospitalist was asked to admit patient for further evaluation and management.  Hospital course:  Syncope -No significant events on telemetry, troponins are flat, 2D echo was obtained, no significant valvular disease, there was a concern about moderate asymmetric left ventricular hypertrophy of the basal septal segment, but this was reviewed again with the on-call cardiologist, there is a no concern about HOCM.  Chest pain -No chest pain today, negative work-up, EKG nonacute, negative troponins.  Tingling/Numbness of left arm and leg - CT of head showed no acute intracranial abnormality CT angiography of neck showed no LVO -MRI brain with no acute findings -Today's evaluation no significant finding on neurological exam -B12 level on the lower range, this may be contributing to the symptoms, he will receive 1 IM shot before discharge today with recommendation to continue with oral supplement as an outpatient, please recheck level in 45-month.  Macrocytosis -Due to B12 deficiency, started on B12 supplements  Right foot pain/plantar fasciitis -Some swelling and tenderness in the heel plantar surface of right foot, possibly to plantar fasciitis, continue with as needed Motrin.  Discharge Diagnoses:  Active Problems:   Syncope   Numbness and tingling of left arm and leg   Chest pain   Essential hypertension    Discharge Instructions   Allergies as of 04/01/2021       Reactions   Chocolate Rash   Hydrochlorothiazide Rash   Lisinopril Rash   Penicillins Rash        Medication List     TAKE these medications  acetaminophen 325 MG tablet Commonly known as: TYLENOL Take 2 tablets (650 mg total) by mouth every 6 (six) hours as needed for mild pain.   ibuprofen 200 MG tablet Commonly known as: Motrin IB Take 1 tablet (200 mg total) by mouth every 8 (eight) hours as  needed for up to 20 doses for mild pain.   vitamin B-12 500 MCG tablet Commonly known as: CYANOCOBALAMIN Take 1 tablet (500 mcg total) by mouth daily.        Allergies  Allergen Reactions   Chocolate Rash   Hydrochlorothiazide Rash   Lisinopril Rash   Penicillins Rash    Consultations: None   Procedures/Studies: CT ANGIO HEAD NECK W WO CM  Result Date: 03/30/2021 CLINICAL DATA:  Initial evaluation for neuro deficit, stroke suspected. EXAM: CT ANGIOGRAPHY HEAD AND NECK TECHNIQUE: Multidetector CT imaging of the head and neck was performed using the standard protocol during bolus administration of intravenous contrast. Multiplanar CT image reconstructions and MIPs were obtained to evaluate the vascular anatomy. Carotid stenosis measurements (when applicable) are obtained utilizing NASCET criteria, using the distal internal carotid diameter as the denominator. CONTRAST:  75mL OMNIPAQUE IOHEXOL 350 MG/ML SOLN COMPARISON:  Prior head CT from earlier the same day. FINDINGS: CTA NECK FINDINGS Aortic arch: Visualized aortic arch normal in caliber with normal 3 vessel morphology. No stenosis or other abnormality about the origin of the great vessels. Right carotid system: Right common and internal carotid arteries widely patent without stenosis, dissection or occlusion. Left carotid system: Left common and internal carotid arteries patent without stenosis, dissection or occlusion. Mild atheromatous change about the origin of the cervical left ICA without stenosis. Vertebral arteries: Both vertebral arteries arise from the subclavian arteries. No proximal subclavian artery stenosis. Both vertebral arteries widely patent without stenosis, dissection or occlusion. Skeleton: No discrete or worrisome osseous lesions. Ordinary for age multilevel cervical spondylosis. Other neck: No other acute soft tissue abnormality within the neck. Upper chest: Visualized upper chest demonstrates no acute finding. Review  of the MIP images confirms the above findings CTA HEAD FINDINGS Anterior circulation: Both internal carotid arteries widely patent to the termini without stenosis. A1 segments widely patent. Normal anterior communicating artery complex. Both anterior cerebral arteries widely patent to their distal aspects without stenosis. No M1 stenosis or occlusion. Normal MCA bifurcations. Distal MCA branches well perfused and symmetric. Posterior circulation: Both V4 segments patent to the vertebrobasilar junction without stenosis. Both PICA origins patent and normal. Basilar widely patent to its distal aspect without stenosis. Superior cerebellar arteries patent bilaterally. Both PCAs primarily supplied via the basilar and are well perfused to there distal aspects. Venous sinuses: Patent allowing for timing the contrast bolus. Anatomic variants: None significant. Review of the MIP images confirms the above findings IMPRESSION: Negative CTA of the head and neck. No large vessel occlusion, hemodynamically significant stenosis, or other acute vascular abnormality. Electronically Signed   By: Rise Mu M.D.   On: 03/30/2021 23:47   DG Chest 2 View  Result Date: 03/30/2021 CLINICAL DATA:  Syncope EXAM: CHEST - 2 VIEW COMPARISON:  10/16/2003 chest radiograph. FINDINGS: Stable cardiomediastinal silhouette with normal heart size. No pneumothorax. No pleural effusion. Lungs appear clear, with no acute consolidative airspace disease and no pulmonary edema. IMPRESSION: No active cardiopulmonary disease. Electronically Signed   By: Delbert Phenix M.D.   On: 03/30/2021 21:53   CT HEAD WO CONTRAST  Result Date: 03/30/2021 CLINICAL DATA:  Syncopal episode EXAM: CT HEAD WITHOUT CONTRAST TECHNIQUE: Contiguous axial  images were obtained from the base of the skull through the vertex without intravenous contrast. COMPARISON:  None. FINDINGS: Brain: No acute territorial infarction, hemorrhage or intracranial mass. The ventricles  are nonenlarged Vascular: No hyperdense vessel or unexpected calcification. Skull: Normal. Negative for fracture or focal lesion. Sinuses/Orbits: No acute finding. Other: None IMPRESSION: Negative non contrasted CT appearance of the brain Electronically Signed   By: Jasmine Pang M.D.   On: 03/30/2021 21:46   MR BRAIN WO CONTRAST  Result Date: 03/31/2021 CLINICAL DATA:  Acute neurologic deficit EXAM: MRI HEAD WITHOUT CONTRAST TECHNIQUE: Multiplanar, multiecho pulse sequences of the brain and surrounding structures were obtained without intravenous contrast. COMPARISON:  None. FINDINGS: Brain: No acute infarct, mass effect or extra-axial collection. No acute or chronic hemorrhage. Normal white matter signal, parenchymal volume and CSF spaces. The midline structures are normal. Vascular: Major flow voids are preserved. Skull and upper cervical spine: Normal calvarium and skull base. Visualized upper cervical spine and soft tissues are normal. Sinuses/Orbits:No paranasal sinus fluid levels or advanced mucosal thickening. No mastoid or middle ear effusion. Normal orbits. IMPRESSION: Normal brain MRI. Electronically Signed   By: Deatra Robinson M.D.   On: 03/31/2021 19:28   US Carotid Bilateral  Result Date: 03/31/2021 CLINICAL DATA:  Syncopal episode. Previous myocardial infarction. History of hypertension. EXAM: BILATERAL CAROTID DUPLEX ULTRASOUND TECHNIQUE: Wallace Cullens scale imaging, color Doppler and duplex ultrasound were performed of bilateral carotid and vertebral arteries in the neck. COMPARISON:  None. FINDINGS: Criteria: Quantification of carotid stenosis is based on velocity parameters that correlate the residual internal carotid diameter with NASCET-based stenosis levels, using the diameter of the distal internal carotid lumen as the denominator for stenosis measurement. The following velocity measurements were obtained: RIGHT ICA: 112/40 cm/sec CCA: 107/20 cm/sec SYSTOLIC ICA/CCA RATIO:  1.1 ECA: 46 cm/sec  LEFT ICA: 82/31 cm/sec CCA: 98/22 cm/sec SYSTOLIC ICA/CCA RATIO:  0.8 ECA: 57 cm/sec RIGHT CAROTID ARTERY: There is a minimal amount of eccentric echogenic plaque involving the mid aspect the right internal carotid artery (image 27), not resulting in elevated peak systolic velocities within the interrogated course of the right internal carotid artery to suggest a hemodynamically significant stenosis. RIGHT VERTEBRAL ARTERY:  Antegrade flow LEFT CAROTID ARTERY: There is a minimal amount of intimal thickening involving the left carotid bulb (images 48 and 50). There is a minimal amount of eccentric echogenic plaque involving the origin and proximal aspects of the left internal carotid artery (image 58), not resulting in elevated peak systolic velocities within the interrogated course of the left internal carotid artery to suggest a hemodynamically significant stenosis. LEFT VERTEBRAL ARTERY:  Antegrade flow IMPRESSION: Minimal amount of bilateral atherosclerotic plaque, left greater than right, not resulting in a hemodynamically significant stenosis within either internal carotid artery. Electronically Signed   By: Simonne Come M.D.   On: 03/31/2021 09:09   ECHOCARDIOGRAM COMPLETE  Result Date: 03/31/2021    ECHOCARDIOGRAM REPORT   Patient Name:   JERMARIO KALMAR Date of Exam: 03/31/2021 Medical Rec #:  [id number]       Height: Accession #:    [id number]      Weight: Date of Birth:  04-15-1966       BSA: Patient Age:    55 years        BP:           105/64 mmHg Patient Gender: M               HR:  66 bpm. [facility] Procedure: 2D Echo, Cardiac Doppler and Color Doppler Indications:    Syncope  History:        Patient has no prior history of Echocardiogram examinations.                 Signs/Symptoms:Syncope and Chest Pain; Risk                 Factors:Hypertension.  Sonographer:    Mikki Harbor Referring Phys: [id number] OLADAPO ADEFESO IMPRESSIONS  1. Left ventricular ejection fraction, by  estimation, is 65 to 70%. The left ventricle has normal function. The left ventricle has no regional wall motion abnormalities. There is moderate asymmetric left ventricular hypertrophy of the basal-septal segment. Left ventricular diastolic parameters were normal.  2. Right ventricular systolic function is normal. The right ventricular size is mildly enlarged. There is normal pulmonary artery systolic pressure. The estimated right ventricular systolic pressure is 30.2 mmHg.  3. Right atrial size was moderately dilated.  4. The mitral valve is normal in structure. Trivial mitral valve regurgitation. No evidence of mitral stenosis.  5. The aortic valve is tricuspid. Aortic valve regurgitation is not visualized. No aortic stenosis is present.  6. The inferior vena cava is normal in size with greater than 50% respiratory variability, suggesting right atrial pressure of 3 mmHg. FINDINGS  Left Ventricle: Left ventricular ejection fraction, by estimation, is 65 to 70%. The left ventricle has normal function. The left ventricle has no regional wall motion abnormalities. The left ventricular internal cavity size was normal in size. There is  moderate asymmetric left ventricular hypertrophy of the basal-septal segment. Left ventricular diastolic parameters were normal. Right Ventricle: The right ventricular size is mildly enlarged. No increase in right ventricular wall thickness. Right ventricular systolic function is normal. There is normal pulmonary artery systolic pressure. The tricuspid regurgitant velocity is 2.61  m/s, and with an assumed right atrial pressure of 3 mmHg, the estimated right ventricular systolic pressure is 30.2 mmHg. Left Atrium: Left atrial size was normal in size. Right Atrium: Right atrial size was moderately dilated. Pericardium: There is no evidence of pericardial effusion. Mitral Valve: The mitral valve is normal in structure. Trivial mitral valve regurgitation. No evidence of mitral valve  stenosis. MV peak gradient, 4.1 mmHg. The mean mitral valve gradient is 1.0 mmHg. Tricuspid Valve: The tricuspid valve is normal in structure. Tricuspid valve regurgitation is trivial. Aortic Valve: The aortic valve is tricuspid. Aortic valve regurgitation is not visualized. No aortic stenosis is present. Aortic valve mean gradient measures 4.0 mmHg. Aortic valve peak gradient measures 8.6 mmHg. Aortic valve area, by VTI measures 2.77 cm. Pulmonic Valve: The pulmonic valve was not well visualized. Pulmonic valve regurgitation is not visualized. Aorta: The aortic root is normal in size and structure. Venous: The inferior vena cava is normal in size with greater than 50% respiratory variability, suggesting right atrial pressure of 3 mmHg. IAS/Shunts: The interatrial septum was not well visualized.  LEFT VENTRICLE PLAX 2D LVIDd:         4.80 cm      Diastology LVIDs:         3.20 cm      LV e' medial:    7.18 cm/s LV PW:         1.20 cm      LV E/e' medial:  9.7 LV IVS:        1.30 cm      LV e' lateral:  14.10 cm/s LVOT diam:     2.00 cm      LV E/e' lateral: 4.9 LV SV:         99 LVOT Area:     3.14 cm  LV Volumes (MOD) LV vol d, MOD A2C: 96.7 ml LV vol d, MOD A4C: 103.0 ml LV vol s, MOD A2C: 19.2 ml LV vol s, MOD A4C: 42.0 ml LV SV MOD A2C:     77.5 ml LV SV MOD A4C:     103.0 ml LV SV MOD BP:      71.3 ml RIGHT VENTRICLE RV Basal diam:  4.10 cm RV Mid diam:    3.50 cm RV S prime:     18.10 cm/s TAPSE (M-mode): 3.6 cm LEFT ATRIUM             RIGHT ATRIUM LA diam:        4.10 cm RA Area:     26.00 cm LA Vol (A2C):   70.2 ml RA Volume:   97.30 ml LA Vol (A4C):   64.2 ml LA Biplane Vol: 71.1 ml  AORTIC VALVE                    PULMONIC VALVE AV Area (Vmax):    2.82 cm     PV Vmax:       1.05 m/s AV Area (Vmean):   2.84 cm     PV Peak grad:  4.4 mmHg AV Area (VTI):     2.77 cm AV Vmax:           147.00 cm/s AV Vmean:          96.200 cm/s AV VTI:            0.358 m AV Peak Grad:      8.6 mmHg AV Mean Grad:       4.0 mmHg LVOT Vmax:         132.00 cm/s LVOT Vmean:        87.100 cm/s LVOT VTI:          0.316 m LVOT/AV VTI ratio: 0.88  AORTA Ao Root diam: 3.60 cm MITRAL VALVE               TRICUSPID VALVE MV Area (PHT): 3.26 cm    TR Peak grad:   27.2 mmHg MV Area VTI:   2.95 cm    TR Vmax:        261.00 cm/s MV Peak grad:  4.1 mmHg MV Mean grad:  1.0 mmHg    SHUNTS MV Vmax:       1.01 m/s    Systemic VTI:  0.32 m MV Vmean:      51.7 cm/s   Systemic Diam: 2.00 cm MV Decel Time: 233 msec MV E velocity: 69.50 cm/s MV A velocity: 87.00 cm/s MV E/A ratio:  0.80 Epifanio Lesches MD Electronically signed by Epifanio Lesches MD Signature Date/Time: 03/31/2021/5:42:18 PM    Final       Subjective:  No chest pain today, still reports tingling numbness in the left foot and fingers, complaining of pain in the right foot. Discharge Exam: Vitals:   04/01/21 0849 04/01/21 1113  BP:  125/79  Pulse:  84  Resp: 20 19  Temp:  97.8 F (36.6 C)  SpO2:  95%   Vitals:   04/01/21 0327 04/01/21 0743 04/01/21 0849 04/01/21 1113  BP: (!) 125/102 118/67  125/79  Pulse: 76 74  84  Resp: 17 16 20 19   Temp: 97.7 F (36.5 C) 97.8 F (36.6 C)  97.8 F (36.6 C)  TempSrc: Oral Oral  Oral  SpO2: 97%   95%  Weight:      Height:        General: Pt is alert, awake, not in acute distress Cardiovascular: RRR, S1/S2 +, no rubs, no gallops Respiratory: CTA bilaterally, no wheezing, no rhonchi Abdominal: Soft, NT, ND, bowel sounds + Extremities: no edema, no cyanosis, patient with some tenderness in the plantar surface of right foot to palpation    The results of significant diagnostics from this hospitalization (including imaging, microbiology, ancillary and laboratory) are listed below for reference.     Microbiology: Recent Results (from the past 240 hour(s))  Resp Panel by RT-PCR (Flu A&B, Covid) Nasopharyngeal Swab     Status: None   Collection Time: 03/30/21  9:18 PM   Specimen: Nasopharyngeal Swab;  Nasopharyngeal(NP) swabs in vial transport medium  Result Value Ref Range Status   SARS Coronavirus 2 by RT PCR NEGATIVE NEGATIVE Final    Comment: (NOTE) SARS-CoV-2 target nucleic acids are NOT DETECTED.  The SARS-CoV-2 RNA is generally detectable in upper respiratory specimens during the acute phase of infection. The lowest concentration of SARS-CoV-2 viral copies this assay can detect is 138 copies/mL. A negative result does not preclude SARS-Cov-2 infection and should not be used as the sole basis for treatment or other patient management decisions. A negative result may occur with  improper specimen collection/handling, submission of specimen other than nasopharyngeal swab, presence of viral mutation(s) within the areas targeted by this assay, and inadequate number of viral copies(<138 copies/mL). A negative result must be combined with clinical observations, patient history, and epidemiological information. The expected result is Negative.  Fact Sheet for Patients:  05/30/21  Fact Sheet for Healthcare Providers:  BloggerCourse.com  This test is no t yet approved or cleared by the [place] FDA and  has been authorized for detection and/or diagnosis of SARS-CoV-2 by FDA under an Emergency Use Authorization (EUA). This EUA will remain  in effect (meaning this test can be used) for the duration of the COVID-19 declaration under Section 564(b)(1) of the Act, 21 U.S.C.section 360bbb-3(b)(1), unless the authorization is terminated  or revoked sooner.       Influenza A by PCR NEGATIVE NEGATIVE Final   Influenza B by PCR NEGATIVE NEGATIVE Final    Comment: (NOTE) The Xpert Xpress SARS-CoV-2/FLU/RSV plus assay is intended as an aid in the diagnosis of influenza from Nasopharyngeal swab specimens and should not be used as a sole basis for treatment. Nasal washings and aspirates are unacceptable for Xpert Xpress  SARS-CoV-2/FLU/RSV testing.  Fact Sheet for Patients: [place]  Fact Sheet for Healthcare Providers: BloggerCourse.com  This test is not yet approved or cleared by the [employer] FDA and has been authorized for detection and/or diagnosis of SARS-CoV-2 by FDA under an Emergency Use Authorization (EUA). This EUA will remain in effect (meaning this test can be used) for the duration of the COVID-19 declaration under Section 564(b)(1) of the Act, 21 U.S.C. section 360bbb-3(b)(1), unless the authorization is terminated or revoked.  Performed at [facility], [address], [place], [place]   MRSA Next Gen by PCR, Nasal     Status: None   Collection Time: 03/31/21  3:13 PM   Specimen: Nasal Mucosa; Nasal Swab  Result Value Ref Range Status   MRSA by PCR Next Gen NOT DETECTED NOT DETECTED  Final    Comment: (NOTE) The GeneXpert MRSA Assay (FDA approved for NASAL specimens only), is one component of a comprehensive MRSA colonization surveillance program. It is not intended to diagnose MRSA infection nor to guide or monitor treatment for MRSA infections. Test performance is not FDA approved in patients less than 14 years old. Performed at [facility], [address], [place], [place] [address]      Labs: BNP (last 3 results) No results for input(s): BNP in the last 8760 hours. Basic Metabolic Panel: Recent Labs  Lab 03/30/21 2110 03/31/21 0545 04/01/21 0050  NA 137 138 137  K 4.0 3.8 3.8  CL 103 104 105  CO2 28 29 25   GLUCOSE 91 95 94  BUN 18 15 12   CREATININE 1.42* 1.18 1.24  CALCIUM 9.4 9.0 8.8*  MG  --  1.9 1.9  PHOS  --  3.5  --    Liver Function Tests: Recent Labs  Lab 03/30/21 2110 03/31/21 0545  AST 15 14*  ALT 14 14  ALKPHOS 105 80  BILITOT 0.9 1.4*  PROT 7.6 6.7  ALBUMIN 4.1 3.8   No results for input(s): LIPASE, AMYLASE in the last 168 hours. No results for input(s):  AMMONIA in the last 168 hours. CBC: Recent Labs  Lab 03/30/21 2110 03/31/21 0545 04/01/21 0050  WBC 4.0 3.3* 3.7*  NEUTROABS 1.6*  --   --   HGB 13.6 12.9* 13.2  HCT 41.1 38.9* 39.1  MCV 104.8* 104.0* 101.6*  PLT 190 177 189   Cardiac Enzymes: No results for input(s): CKTOTAL, CKMB, CKMBINDEX, TROPONINI in the last 168 hours. BNP: Invalid input(s): POCBNP CBG: Recent Labs  Lab 03/30/21 2200  GLUCAP 91   D-Dimer No results for input(s): DDIMER in the last 72 hours. Hgb A1c Recent Labs    03/31/21 0545  HGBA1C 4.4*   Lipid Profile Recent Labs    03/31/21 0545  CHOL 191  HDL 62  LDLCALC 125*  TRIG 21  CHOLHDL 3.1   Thyroid function studies No results for input(s): TSH, T4TOTAL, T3FREE, THYROIDAB in the last 72 hours.  Invalid input(s): FREET3 Anemia work up Recent Labs    03/31/21 0545  VITAMINB12 231  FOLATE 17.2   Urinalysis    Component Value Date/Time   COLORURINE YELLOW 03/30/2021 2208   APPEARANCEUR CLEAR 03/30/2021 2208   LABSPEC 1.014 03/30/2021 2208   PHURINE 6.0 03/30/2021 2208   GLUCOSEU NEGATIVE 03/30/2021 2208   HGBUR NEGATIVE 03/30/2021 2208   BILIRUBINUR NEGATIVE 03/30/2021 2208   KETONESUR NEGATIVE 03/30/2021 2208   PROTEINUR NEGATIVE 03/30/2021 2208   NITRITE NEGATIVE 03/30/2021 2208   LEUKOCYTESUR NEGATIVE 03/30/2021 2208   Sepsis Labs Invalid input(s): PROCALCITONIN,  WBC,  LACTICIDVEN Microbiology Recent Results (from the past 240 hour(s))  Resp Panel by RT-PCR (Flu A&B, Covid) Nasopharyngeal Swab     Status: None   Collection Time: 03/30/21  9:18 PM   Specimen: Nasopharyngeal Swab; Nasopharyngeal(NP) swabs in vial transport medium  Result Value Ref Range Status   SARS Coronavirus 2 by RT PCR NEGATIVE NEGATIVE Final    Comment: (NOTE) SARS-CoV-2 target nucleic acids are NOT DETECTED.  The SARS-CoV-2 RNA is generally detectable in upper respiratory specimens during the acute phase of infection. The lowest concentration  of SARS-CoV-2 viral copies this assay can detect is 138 copies/mL. A negative result does not preclude SARS-Cov-2 infection and should not be used as the sole basis for treatment or other patient management decisions. A negative  result may occur with  improper specimen collection/handling, submission of specimen other than nasopharyngeal swab, presence of viral mutation(s) within the areas targeted by this assay, and inadequate number of viral copies(<138 copies/mL). A negative result must be combined with clinical observations, patient history, and epidemiological information. The expected result is Negative.  Fact Sheet for Patients:  BloggerCourse.com  Fact Sheet for Healthcare Providers:  [employer]iousBroker.it  This test is no t yet approved or cleared by the [place] FDA and  has been authorized for detection and/or diagnosis of SARS-CoV-2 by FDA under an Emergency Use Authorization (EUA). This EUA will remain  in effect (meaning this test can be used) for the duration of the COVID-19 declaration under Section 564(b)(1) of the Act, 21 U.S.C.section 360bbb-3(b)(1), unless the authorization is terminated  or revoked sooner.       Influenza A by PCR NEGATIVE NEGATIVE Final   Influenza B by PCR NEGATIVE NEGATIVE Final    Comment: (NOTE) The Xpert Xpress SARS-CoV-2/FLU/RSV plus assay is intended as an aid in the diagnosis of influenza from Nasopharyngeal swab specimens and should not be used as a sole basis for treatment. Nasal washings and aspirates are unacceptable for Xpert Xpress SARS-CoV-2/FLU/RSV testing.  Fact Sheet for Patients: BloggerCourse.com  Fact Sheet for Healthcare Providers: SeriousBroker.it  This test is not yet approved or cleared by the [place] FDA and has been authorized for detection and/or diagnosis of SARS-CoV-2 by FDA under an Emergency Use  Authorization (EUA). This EUA will remain in effect (meaning this test can be used) for the duration of the COVID-19 declaration under Section 564(b)(1) of the Act, 21 U.S.C. section 360bbb-3(b)(1), unless the authorization is terminated or revoked.  Performed at [facility], [address] [place], [place] [address]   MRSA Next Gen by PCR, Nasal     Status: None   Collection Time: 03/31/21  3:13 PM   Specimen: Nasal Mucosa; Nasal Swab  Result Value Ref Range Status   MRSA by PCR Next Gen NOT DETECTED NOT DETECTED Final    Comment: (NOTE) The GeneXpert MRSA Assay (FDA approved for NASAL specimens only), is one component of a comprehensive MRSA colonization surveillance program. It is not intended to diagnose MRSA infection nor to guide or monitor treatment for MRSA infections. Test performance is not FDA approved in patients less than 54 years old. Performed at [facility], [address] [place], [place] [address]      Time coordinating discharge: 30 minutes  SIGNED:   Huey Bienenstock, MD  [facility] 04/01/2021, 11:21 AM Pager   If 7PM-7AM, please contact night-coverage [email or web address] Password [email or web address]

## 2021-04-01 NOTE — Discharge Instructions (Signed)
Follow with Primary MD  Get CBC, CMP, checked  by Primary MD next visit.    Activity: As tolerated with Full fall precautions use walker/cane & assistance as needed    Diet: Heart Healthy diet  On your next visit with your primary care physician please Get Medicines reviewed and adjusted.   Please request your Prim.MD to go over all Hospital Tests and Procedure/Radiological results at the follow up, please get all Hospital records sent to your Prim MD by signing hospital release before you go home.   If you experience worsening of your admission symptoms, develop shortness of breath, life threatening emergency, suicidal or homicidal thoughts you must seek medical attention immediately by calling 911 or calling your MD immediately  if symptoms less severe.  You Must read complete instructions/literature along with all the possible adverse reactions/side effects for all the Medicines you take and that have been prescribed to you. Take any new Medicines after you have completely understood and accpet all the possible adverse reactions/side effects.   Do not drive, operating heavy machinery, perform activities at heights, swimming or participation in water activities or provide baby sitting services if your were admitted for syncope or siezures until you have seen by Primary MD or a Neurologist and advised to do so again.  Do not drive when taking Pain medications.    Do not take more than prescribed Pain, Sleep and Anxiety Medications  Special Instructions: If you have smoked or chewed Tobacco  in the last 2 yrs please stop smoking, stop any regular Alcohol  and or any Recreational drug use.  Wear Seat belts while driving.   Please note  You were cared for by a hospitalist during your hospital stay. If you have any questions about your discharge medications or the care you received while you were in the hospital after you are discharged, you can call the unit and asked to speak with  the hospitalist on call if the hospitalist that took care of you is not available. Once you are discharged, your primary care physician will handle any further medical issues. Please note that NO REFILLS for any discharge medications will be authorized once you are discharged, as it is imperative that you return to your primary care physician (or establish a relationship with a primary care physician if you do not have one) for your aftercare needs so that they can reassess your need for medications and monitor your lab values.

## 2021-04-01 NOTE — Evaluation (Signed)
Occupational Therapy Evaluation Patient Details Name: Marcus Dunn MRN: [id number] DOB: 25-Aug-1965 Today's Date: 04/01/2021   History of Present Illness The pt is a 55 yo male presenting 10/7 with c/o chest pain following syncopal event initially, once in ED also reporting L-sided tingling/numbness. Continued workup by neurology, MRI of brain shows no acute abnormality. PMH includes: HTN, MI, and knee surgery.   Clinical Impression   Patient evaluated by Occupational Therapy with no further acute OT needs identified. All education has been completed and the patient has no further questions. Pt is able to complete ADLs with min guard assist - he is limited by Rt foot pain.   See below for any follow-up Occupational Therapy or equipment needs. OT is signing off. Thank you for this referral.       Recommendations for follow up therapy are one component of a multi-disciplinary discharge planning process, led by the attending physician.  Recommendations may be updated based on patient status, additional functional criteria and insurance authorization.   Follow Up Recommendations  No OT follow up    Equipment Recommendations  None recommended by OT    Recommendations for Other Services       Precautions / Restrictions Precautions Precautions: Fall      Mobility Bed Mobility Overal bed mobility: Independent                  Transfers Overall transfer level: Needs assistance Equipment used: None Transfers: Sit to/from [employer] Sit to Stand: Min guard Stand pivot transfers: Min guard       General transfer comment: min guard due to antalgic gait due to Rt foot pain    Balance           Standing balance support: During functional activity;No upper extremity supported Standing balance-Leahy Scale: Fair                             ADL either performed or assessed with clinical judgement   ADL Overall ADL's : Needs  assistance/impaired Eating/Feeding: Independent   Grooming: Wash/dry hands;Wash/dry face;Brushing hair;Supervision/safety;Standing   Upper Body Bathing: Set up;Sitting   Lower Body Bathing: Min guard;Sit to/from stand   Upper Body Dressing : Set up;Sitting   Lower Body Dressing: Min guard;Sit to/from stand   Toilet Transfer: Min guard;Ambulation;Comfort height toilet   Toileting- Clothing Manipulation and Hygiene: Min guard;Sit to/from stand       Functional mobility during ADLs: Min guard General ADL Comments: pt with antalgic gait pattern due to c/o 7/10 pain instep/arch Rt foot with mild edema which he reports is new onset - MD notified     Vision Baseline Vision/History: 1 Wears glasses (reading glasses) Ability to See in Adequate Light: 0 Adequate Patient Visual Report: No change from baseline Vision Assessment?: Yes Eye Alignment: Within Functional Limits Ocular Range of Motion: Within Functional Limits Alignment/Gaze Preference: Within Defined Limits Tracking/Visual Pursuits: Able to track stimulus in all quads without difficulty Visual Fields: No apparent deficits     Development worker, international aid Tested?: Yes   Praxis Praxis Praxis tested?: Within functional limits    Pertinent Vitals/Pain Pain Assessment: No/denies pain     Hand Dominance Right   Extremity/Trunk Assessment Upper Extremity Assessment RUE Deficits / Details: 5/5 LUE Deficits / Details: 5/5.  Pt reports tingling in fingertips that has persisted.  He was able to manipulate object in hand LUE Sensation: decreased light touch   Lower Extremity  Assessment Lower Extremity Assessment: Defer to PT evaluation   Cervical / Trunk Assessment Cervical / Trunk Assessment: Normal   Communication Communication Communication: No difficulties   Cognition Arousal/Alertness: Awake/alert Behavior During Therapy: WFL for tasks assessed/performed;Flat affect Overall Cognitive Status: Within  Functional Limits for tasks assessed                                     General Comments       Exercises     Shoulder Instructions      Home Living Family/patient expects to be discharged to:: [facility]                                        Prior Functioning/Environment Level of Independence: Independent        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      OT Treatment/Interventions:      OT Goals(Current goals can be found in the care plan section) Acute Rehab OT Goals OT Goal Formulation: All assessment and education complete, DC therapy  OT Frequency:     Barriers to D/C:            Co-evaluation              AM-PAC OT "6 Clicks" Daily Activity     Outcome Measure Help from another person eating meals?: None Help from another person taking care of personal grooming?: A Little Help from another person toileting, which includes using toliet, bedpan, or urinal?: A Little Help from another person bathing (including washing, rinsing, drying)?: A Little Help from another person to put on and taking off regular upper body clothing?: A Little Help from another person to put on and taking off regular lower body clothing?: A Little 6 Click Score: 19   End of Session Equipment Utilized During Treatment: Gait belt Nurse Communication: Mobility status  Activity Tolerance: Patient tolerated treatment well Patient left: in bed;with call bell/phone within reach;Other (comment) (guard present)  OT Visit Diagnosis: Unsteadiness on feet (R26.81);Pain Pain - Right/Left: Right Pain - part of body: Ankle and joints of foot                Time: 2595-6387 OT Time Calculation (min): 16 min Charges:  OT General Charges $OT Visit: 1 Visit OT Evaluation $OT Eval Low Complexity: 1 Low  Eber Jones., OTR/L Acute Rehabilitation Services Pager [phone] Office [phone]   Jeani Hawking  M 04/01/2021, 9:40 AM

## 2021-04-01 NOTE — Evaluation (Signed)
Clinical/Bedside Swallow Evaluation Patient Details  Name: Marcus Dunn MRN: [id number] Date of Birth: Jul 08, 1965  Today's Date: 04/01/2021 Time: SLP Start Time (ACUTE ONLY): 1017 SLP Stop Time (ACUTE ONLY): 1028 SLP Time Calculation (min) (ACUTE ONLY): 11 min  Past Medical History:  Past Medical History:  Diagnosis Date   Hypertension    MI (myocardial infarction) (HCC)    Past Surgical History:  Past Surgical History:  Procedure Laterality Date   HERNIA REPAIR     KNEE SURGERY     SHOULDER SURGERY     HPI:  Pt is a 55 y.o. male who presents to the ED from [facility] d/t chest pain following syncopal event initially. In ED pt reported L-sided tingling/numbness. MRI brain negative for acute abnormality. CXR on admission clear. PMH: HTN, MI, and knee surgery.    Assessment / Plan / Recommendation  Clinical Impression  Pt was seen for bedside swallow evaluation and he denied a history of dysphagia. Oral mechanism exam was [facility] and he presented with adequate, natural dentition. He tolerated all solids and liquids without signs or symptoms of oropharyngeal dysphagia. A regular texture diet with thin liquids is recommended at this time and further skilled SLP services are not clinically indicated for swallowing. SLP Visit Diagnosis: Dysphagia, unspecified (R13.10)    Aspiration Risk  No limitations    Diet Recommendation Regular;Thin liquid   Liquid Administration via: Cup;Straw Medication Administration: Whole meds with liquid Supervision: Patient able to self feed Postural Changes: Seated upright at 90 degrees    Other  Recommendations Oral Care Recommendations: Oral care BID    Recommendations for follow up therapy are one component of a multi-disciplinary discharge planning process, led by the attending physician.  Recommendations may be updated based on patient status, additional functional criteria and insurance authorization.  Follow up Recommendations None       Frequency and Duration            Prognosis Prognosis for Safe Diet Advancement: Good      Swallow Study   General Date of Onset: 03/31/21 HPI: Pt is a 55 y.o. male who presents to the ED from [facility] d/t chest pain following syncopal event initially. In ED pt reported L-sided tingling/numbness. MRI brain negative for acute abnormality. CXR on admission clear. PMH: HTN, MI, and knee surgery. Type of Study: Bedside Swallow Evaluation Previous Swallow Assessment: none Diet Prior to this Study: Regular;Thin liquids Temperature Spikes Noted: No Respiratory Status: Room air History of Recent Intubation: No Behavior/Cognition: Cooperative;Alert;Pleasant mood Oral Cavity Assessment: Within Functional Limits Oral Care Completed by SLP: No Oral Cavity - Dentition: Adequate natural dentition Vision: Functional for self-feeding Self-Feeding Abilities: Able to feed self Patient Positioning: Upright in bed;Postural control adequate for testing Baseline Vocal Quality: Normal Volitional Cough: Strong Volitional Swallow: Able to elicit    Oral/Motor/Sensory Function Overall Oral Motor/Sensory Function: Within functional limits   Ice Chips Ice chips: Not tested   Thin Liquid Thin Liquid: Within functional limits Presentation: Straw    Nectar Thick Nectar Thick Liquid: Not tested   Honey Thick Honey Thick Liquid: Not tested   Puree Puree: Within functional limits Presentation: Spoon   Solid     Solid: Within functional limits Presentation: Self Fed     Isley Weisheit I. Vear Clock, MS, CCC-SLP Acute Rehabilitation Services Office number [phone] Pager [phone]  Scheryl Marten 04/01/2021,10:28 AM

## 2021-04-01 NOTE — Progress Notes (Signed)
Pt got discharged , discharge instructions provided and patient showed understanding to it, IV taken out,Telemonitor DC,pt left unit in wheelchair with all of the belongings accompanied with a Emergency planning/management officer.  Lonia Farber, RN

## 2021-05-13 ENCOUNTER — Observation Stay (HOSPITAL_COMMUNITY)
Admission: EM | Admit: 2021-05-13 | Discharge: 2021-05-14 | Disposition: A | Discharge status: Home / Self Care | Attending: Family Medicine | Admitting: Family Medicine

## 2021-05-13 ENCOUNTER — Encounter (HOSPITAL_COMMUNITY): Payer: Self-pay | Admitting: *Deleted

## 2021-05-13 ENCOUNTER — Emergency Department (HOSPITAL_COMMUNITY)

## 2021-05-13 DIAGNOSIS — Z8673 Personal history of transient ischemic attack (TIA), and cerebral infarction without residual deficits: Secondary | ICD-10-CM | POA: Diagnosis not present

## 2021-05-13 DIAGNOSIS — Z79899 Other long term (current) drug therapy: Secondary | ICD-10-CM | POA: Diagnosis not present

## 2021-05-13 DIAGNOSIS — R202 Paresthesia of skin: Secondary | ICD-10-CM | POA: Diagnosis not present

## 2021-05-13 DIAGNOSIS — I2510 Atherosclerotic heart disease of native coronary artery without angina pectoris: Secondary | ICD-10-CM | POA: Insufficient documentation

## 2021-05-13 DIAGNOSIS — I10 Essential (primary) hypertension: Secondary | ICD-10-CM | POA: Diagnosis present

## 2021-05-13 DIAGNOSIS — Z20822 Contact with and (suspected) exposure to covid-19: Secondary | ICD-10-CM | POA: Insufficient documentation

## 2021-05-13 DIAGNOSIS — R200 Anesthesia of skin: Secondary | ICD-10-CM | POA: Diagnosis not present

## 2021-05-13 DIAGNOSIS — G43909 Migraine, unspecified, not intractable, without status migrainosus: Secondary | ICD-10-CM | POA: Diagnosis present

## 2021-05-13 DIAGNOSIS — I252 Old myocardial infarction: Secondary | ICD-10-CM

## 2021-05-13 DIAGNOSIS — G43109 Migraine with aura, not intractable, without status migrainosus: Secondary | ICD-10-CM | POA: Diagnosis not present

## 2021-05-13 DIAGNOSIS — R531 Weakness: Principal | ICD-10-CM

## 2021-05-13 LAB — I-STAT CHEM 8, ED
BUN: 15 mg/dL (ref 6–20)
Calcium, Ion: 1.26 mmol/L (ref 1.15–1.40)
Chloride: 102 mmol/L (ref 98–111)
Creatinine, Ser: 1.3 mg/dL — ABNORMAL HIGH (ref 0.61–1.24)
Glucose, Bld: 98 mg/dL (ref 70–99)
HCT: 43 % (ref 39.0–52.0)
Hemoglobin: 14.6 g/dL (ref 13.0–17.0)
Potassium: 4.2 mmol/L (ref 3.5–5.1)
Sodium: 139 mmol/L (ref 135–145)
TCO2: 29 mmol/L (ref 22–32)

## 2021-05-13 LAB — COMPREHENSIVE METABOLIC PANEL
ALT: 15 U/L (ref 0–44)
AST: 16 U/L (ref 15–41)
Albumin: 4 g/dL (ref 3.5–5.0)
Alkaline Phosphatase: 91 U/L (ref 38–126)
Anion gap: 6 (ref 5–15)
BUN: 15 mg/dL (ref 6–20)
CO2: 29 mmol/L (ref 22–32)
Calcium: 9.5 mg/dL (ref 8.9–10.3)
Chloride: 102 mmol/L (ref 98–111)
Creatinine, Ser: 1.19 mg/dL (ref 0.61–1.24)
GFR, Estimated: >60 mL/min (ref >60)
Glucose, Bld: 103 mg/dL — ABNORMAL HIGH (ref 70–99)
Potassium: 4.2 mmol/L (ref 3.5–5.1)
Sodium: 137 mmol/L (ref 135–145)
Total Bilirubin: 1 mg/dL (ref 0.3–1.2)
Total Protein: 7.3 g/dL (ref 6.5–8.1)

## 2021-05-13 LAB — CBG MONITORING, ED: Glucose-Capillary: 69 mg/dL — ABNORMAL LOW (ref 70–99)

## 2021-05-13 LAB — CBC
HCT: 42.8 % (ref 39.0–52.0)
Hemoglobin: 14.2 g/dL (ref 13.0–17.0)
MCH: 34.3 pg — ABNORMAL HIGH (ref 26.0–34.0)
MCHC: 33.2 g/dL (ref 30.0–36.0)
MCV: 103.4 fL — ABNORMAL HIGH (ref 80.0–100.0)
Platelets: 191 10*3/uL (ref 150–400)
RBC: 4.14 MIL/uL — ABNORMAL LOW (ref 4.22–5.81)
RDW: 11.5 % (ref 11.5–15.5)
WBC: 3.7 10*3/uL — ABNORMAL LOW (ref 4.0–10.5)
nRBC: 0 % (ref 0.0–0.2)

## 2021-05-13 LAB — RESP PANEL BY RT-PCR (FLU A&B, COVID) ARPGX2
Influenza A by PCR: NEGATIVE
Influenza B by PCR: NEGATIVE
SARS Coronavirus 2 by RT PCR: NEGATIVE

## 2021-05-13 LAB — DIFFERENTIAL
Abs Immature Granulocytes: 0.02 10*3/uL (ref 0.00–0.07)
Basophils Absolute: 0 10*3/uL (ref 0.0–0.1)
Basophils Relative: 1 %
Eosinophils Absolute: 0.2 10*3/uL (ref 0.0–0.5)
Eosinophils Relative: 5 %
Immature Granulocytes: 1 %
Lymphocytes Relative: 34 %
Lymphs Abs: 1.3 10*3/uL (ref 0.7–4.0)
Monocytes Absolute: 0.3 10*3/uL (ref 0.1–1.0)
Monocytes Relative: 8 %
Neutro Abs: 1.9 10*3/uL (ref 1.7–7.7)
Neutrophils Relative %: 51 %

## 2021-05-13 LAB — PROTIME-INR
INR: 1 (ref 0.8–1.2)
Prothrombin Time: 12.8 seconds (ref 11.4–15.2)

## 2021-05-13 LAB — APTT: aPTT: 30 seconds (ref 24–36)

## 2021-05-13 IMAGING — CT CT HEAD CODE STROKE
3 series · 15 of 47 positions shown, 18 images · non-contrast
Comparison: Brain MRI [DATE]. CTA head and neck [DATE],
head CT.
COMPARISON: Brain MRI [DATE]. CTA head and neck [DATE],
head CT.

Addendum:
CLINICAL DATA: Code stroke. 55-year-old male sudden onset numbness,
stroke symptoms.

EXAM:
CT HEAD WITHOUT CONTRAST
TECHNIQUE: Contiguous axial images were obtained from the base of the skull
through the vertex without intravenous contrast.

[Series 4: head w o · axial · 0.53mm/px · z∈[+15,+170]mm · 9 of 37 slices shown, 12 images]
[im 3/37  brain]
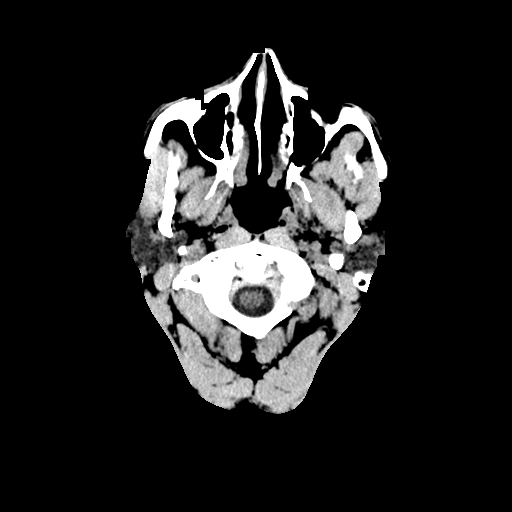
[im 3/37  bone]
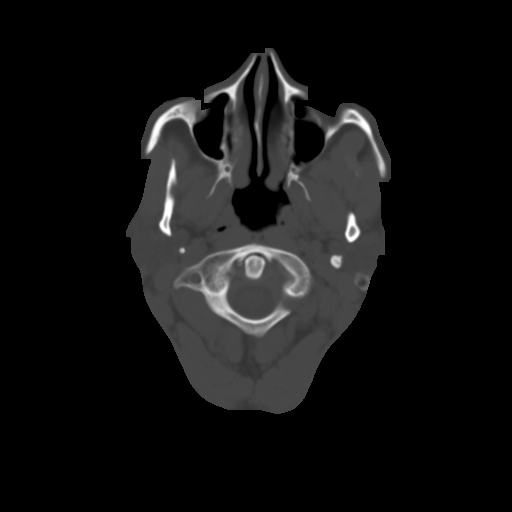
[im 7/37  brain]
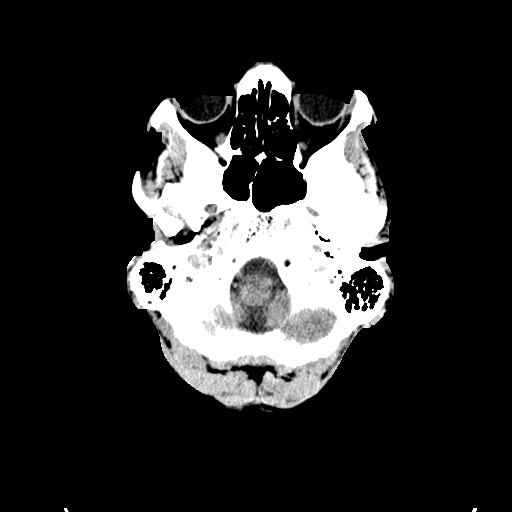
[im 10/37  brain]
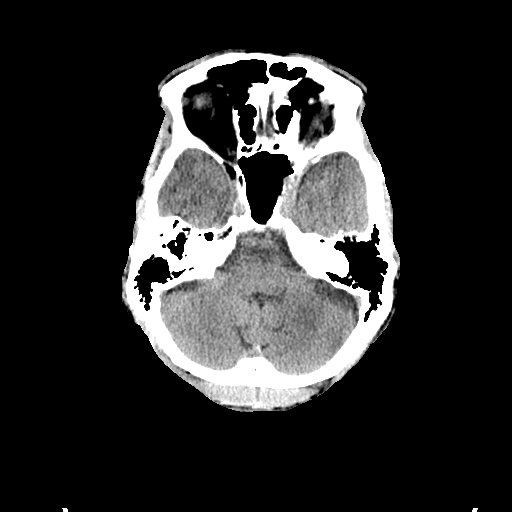
[im 14/37  brain]
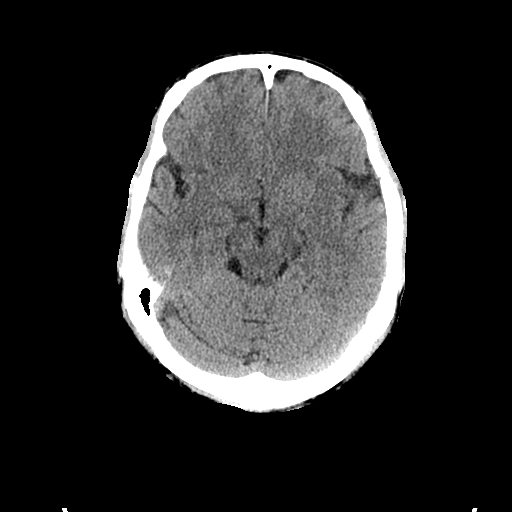
[im 19/37  brain]
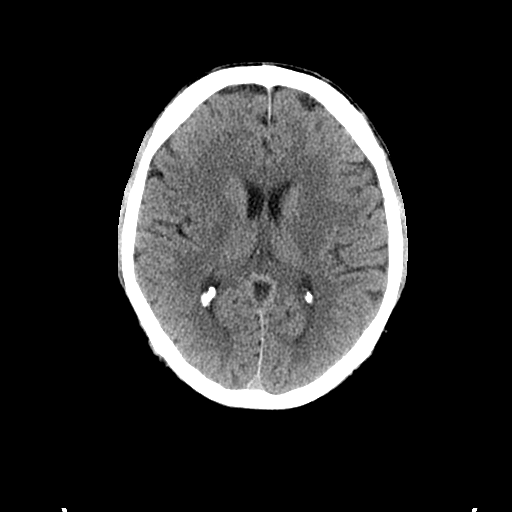
[im 19/37  bone]
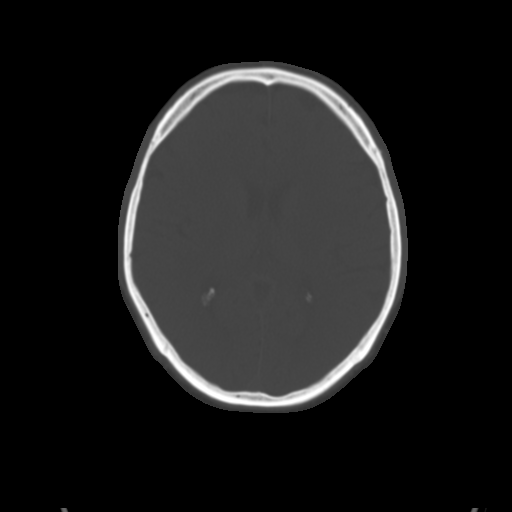
[im 23/37  brain]
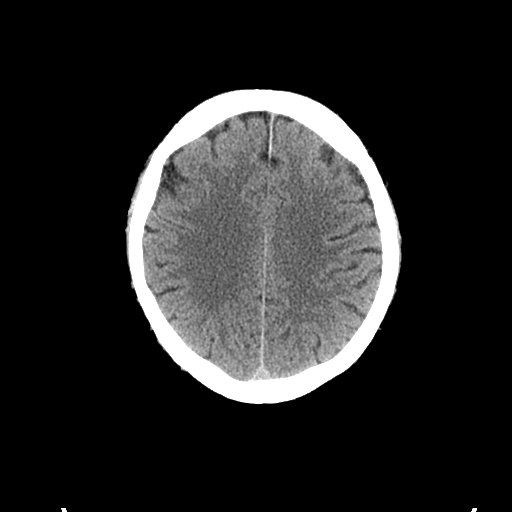
[im 27/37  brain]
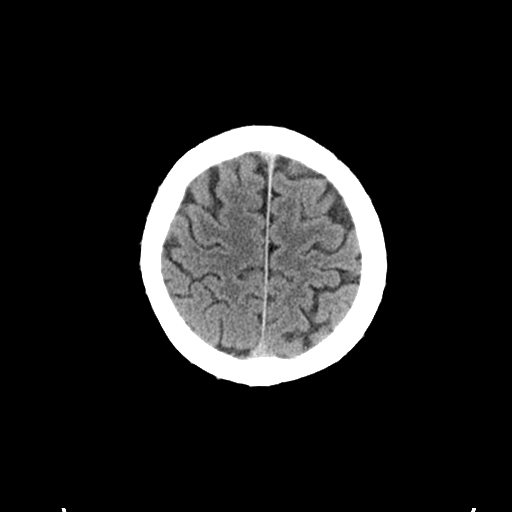
[im 30/37  brain]
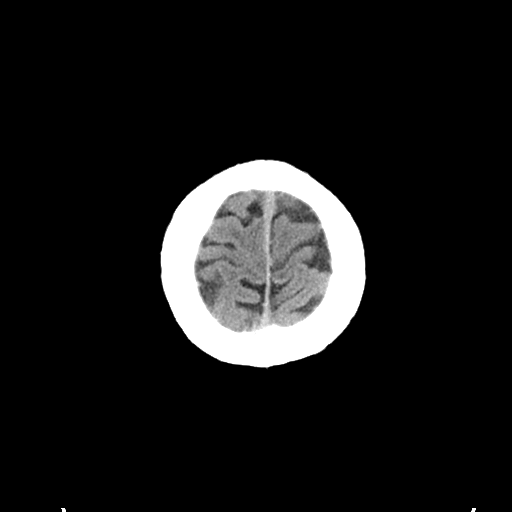
[im 34/37  brain]
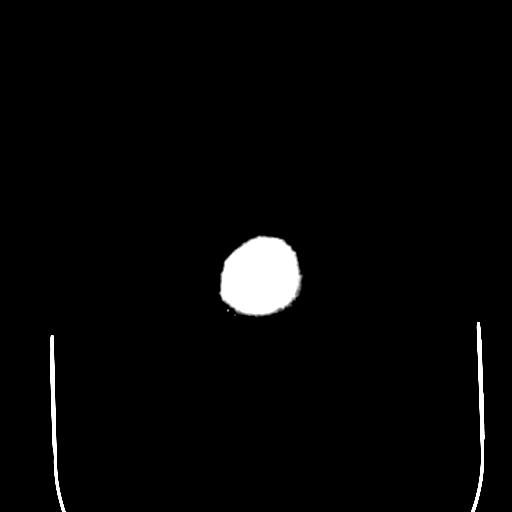
[im 34/37  bone]
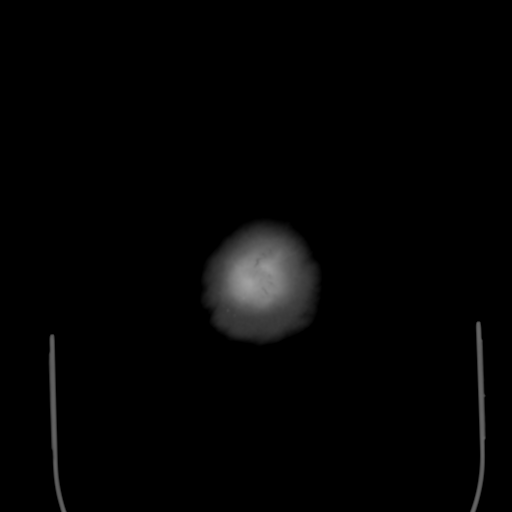

[Series 6: coronal soft · coronal · 0.38mm/px · 3 of 68 slices shown]
[im 23/68  brain]
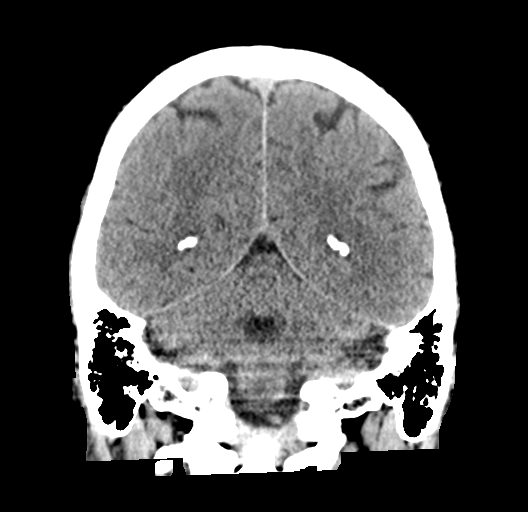
[im 30/68  brain]
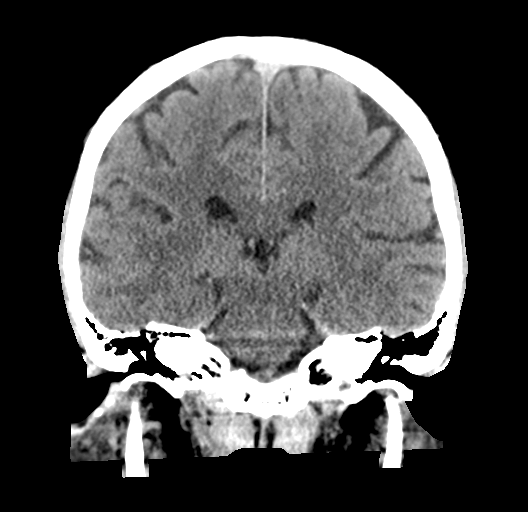
[im 38/68  brain]
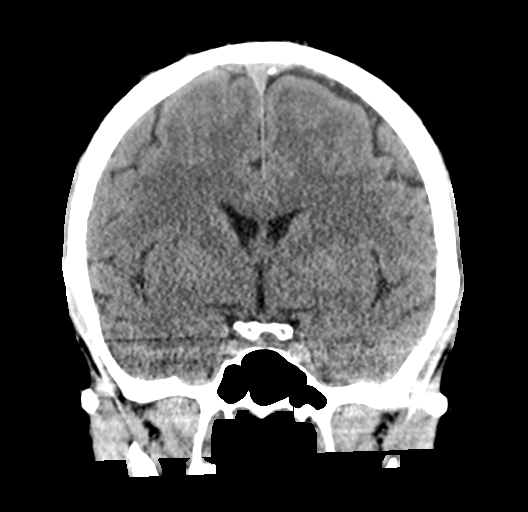

[Series 7: sagittal soft · sagittal · 0.37mm/px · 3 of 57 slices shown]
[im 19/57  brain]
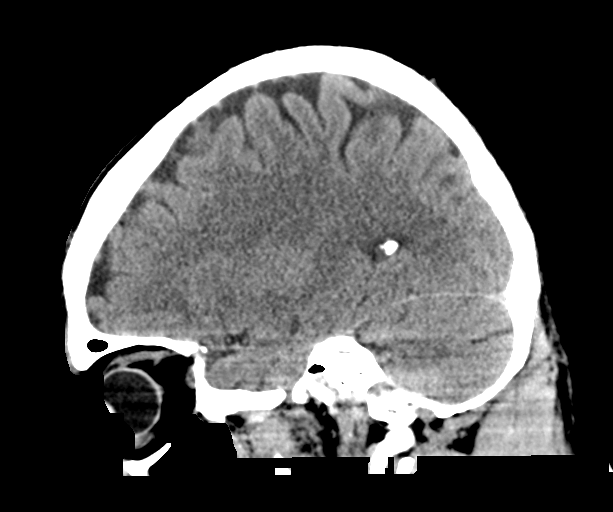
[im 29/57  brain]
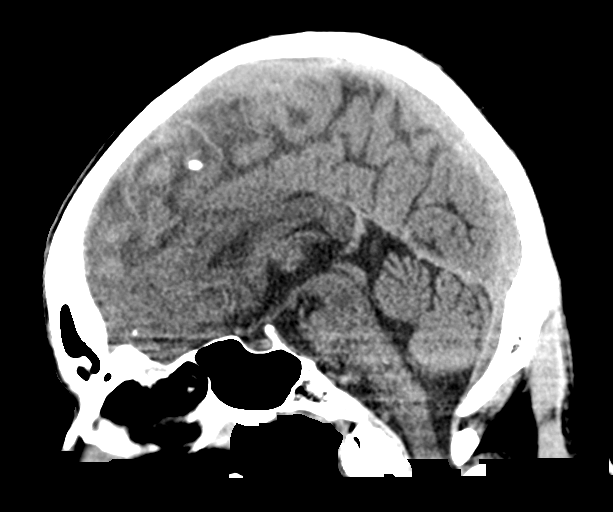
[im 38/57  brain]
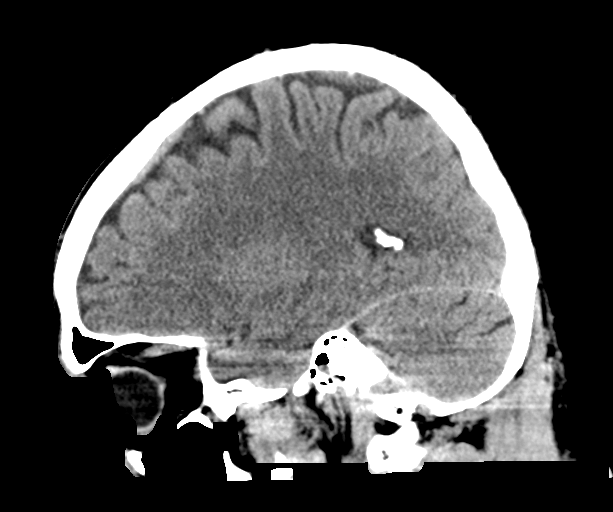

[15 of 47 positions shown; findings below may reference images not displayed]

FINDINGS: Brain: Subtle chronic lacunar infarct in the left caudate is stable
from last month. Elsewhere stable and normal gray-white matter
differentiation throughout the brain. No midline shift,
ventriculomegaly, mass effect, evidence of mass lesion, intracranial
hemorrhage or evidence of cortically based acute infarction.
Gray-white matter differentiation is within normal limits throughout
the brain.

Vascular: No suspicious intracranial vascular hyperdensity.

Skull: Negative.

Sinuses/Orbits: Visualized paranasal sinuses and mastoids are stable
and well aerated.

Other: Visualized orbits and scalp soft tissues are within normal
limits.

ASPECTS ([place] Stroke Program Early CT Score)

Total score (0-10 with 10 being normal): 10
IMPRESSION: 1. No acute cortically based infarct or acute intracranial
hemorrhage identified. ASPECTS 10.
2. Negative noncontrast CT appearance of the brain aside from a
subtle chronic left caudate lacune.

ADDENDUM:
Study discussed by telephone with Dr. REID on [DATE]
at [4S] hours.

*** End of Addendum ***
FINDINGS: Brain: Subtle chronic lacunar infarct in the left caudate is stable
from last month. Elsewhere stable and normal gray-white matter
differentiation throughout the brain. No midline shift,
ventriculomegaly, mass effect, evidence of mass lesion, intracranial
hemorrhage or evidence of cortically based acute infarction.
Gray-white matter differentiation is within normal limits throughout
the brain.

Vascular: No suspicious intracranial vascular hyperdensity.

Skull: Negative.

Sinuses/Orbits: Visualized paranasal sinuses and mastoids are stable
and well aerated.

Other: Visualized orbits and scalp soft tissues are within normal
limits.

ASPECTS (Alberta Stroke Program Early CT Score)

Total score (0-10 with 10 being normal): 10
IMPRESSION: 1. No acute cortically based infarct or acute intracranial
hemorrhage identified. ASPECTS 10.
2. Negative noncontrast CT appearance of the brain aside from a
subtle chronic left caudate lacune.

## 2021-05-13 MED ORDER — STROKE: EARLY STAGES OF RECOVERY BOOK
Freq: Once | Status: DC
  Filled 2021-05-13: qty 1

## 2021-05-13 MED ORDER — ACETAMINOPHEN 160 MG/5ML PO SOLN: 650.0000 mg | ORAL | Status: DC | PRN

## 2021-05-13 MED ORDER — SENNOSIDES-DOCUSATE SODIUM 8.6-50 MG PO TABS: 1.0000 | ORAL_TABLET | Freq: Every evening | ORAL | Status: DC | PRN

## 2021-05-13 MED ORDER — ACETAMINOPHEN 650 MG RE SUPP: 650.0000 mg | RECTAL | Status: DC | PRN

## 2021-05-13 MED ORDER — SODIUM CHLORIDE 0.9% FLUSH
3.0000 mL | Freq: Once | INTRAVENOUS | Status: AC
Start: 2021-05-13 — End: 2021-05-13
  Administered 2021-05-13: 3 mL via INTRAVENOUS

## 2021-05-13 MED ORDER — ASPIRIN EC 81 MG PO TBEC
81.0000 mg | DELAYED_RELEASE_TABLET | Freq: Every day | ORAL | Status: DC
  Administered 2021-05-13 – 2021-05-14 (×2): 81 mg via ORAL
  Filled 2021-05-13 (×2): qty 1

## 2021-05-13 MED ORDER — SODIUM CHLORIDE 0.9 % IV BOLUS
1000.0000 mL | Freq: Once | INTRAVENOUS | Status: AC
  Administered 2021-05-13: 1000 mL via INTRAVENOUS

## 2021-05-13 MED ORDER — VITAMIN B-12 100 MCG PO TABS
500.0000 ug | ORAL_TABLET | Freq: Every day | ORAL | Status: DC
  Administered 2021-05-14: 500 ug via ORAL
  Filled 2021-05-13: qty 5

## 2021-05-13 MED ORDER — ENOXAPARIN SODIUM 40 MG/0.4ML IJ SOSY
40.0000 mg | PREFILLED_SYRINGE | INTRAMUSCULAR | Status: DC
  Administered 2021-05-13: 40 mg via SUBCUTANEOUS
  Filled 2021-05-13: qty 0.4

## 2021-05-13 MED ORDER — DIPHENHYDRAMINE HCL 50 MG/ML IJ SOLN
12.5000 mg | Freq: Once | INTRAMUSCULAR | Status: AC
  Administered 2021-05-13: 12.5 mg via INTRAVENOUS
  Filled 2021-05-13: qty 1

## 2021-05-13 MED ORDER — METOCLOPRAMIDE HCL 5 MG/ML IJ SOLN
5.0000 mg | Freq: Once | INTRAMUSCULAR | Status: AC
  Administered 2021-05-13: 5 mg via INTRAVENOUS
  Filled 2021-05-13: qty 2

## 2021-05-13 MED ORDER — ACETAMINOPHEN 325 MG PO TABS
650.0000 mg | ORAL_TABLET | ORAL | Status: DC | PRN
  Administered 2021-05-14 (×2): 650 mg via ORAL
  Filled 2021-05-13: qty 2

## 2021-05-13 MED ORDER — KETOROLAC TROMETHAMINE 30 MG/ML IJ SOLN
30.0000 mg | Freq: Once | INTRAMUSCULAR | Status: AC
  Administered 2021-05-13: 30 mg via INTRAVENOUS
  Filled 2021-05-13: qty 1

## 2021-05-13 MED ORDER — SODIUM CHLORIDE 0.9 % IV SOLN: INTRAVENOUS | Status: AC

## 2021-05-13 NOTE — H&P (Signed)
History and Physical  [facility]  Marcus Dunn HWE:[id number] DOB: [birth year] DOA: 05/13/2021  PCP: Pcp, No  Patient coming from: Home  Level of care: Telemetry  I have personally briefly reviewed patient's old medical records in [facility]  Chief Complaint: acute recurrent left sided paresthesias and weakness   HPI: Marcus Dunn is a 55 y.o. male with medical history significant for hypertension, coronary artery disease status post MI, history of TIA, recent hospitalization for similar symptoms of left-sided paresthesias and numbness in suspected migraine headaches.  He presented to the emergency department after experiencing symptoms of weakness and numbness and tingling on the left side of the body involving the upper and lower extremity.  He also has some experience of left-sided facial numbness.  He reports that he woke up feeling normal and at baseline at 4 AM and around 6:30 AM after eating breakfast he started to feel the symptoms.  He also reported that he felt like he was drooling when he was eating his breakfast.  He reported that during his last hospitalization for similar symptoms he had a TIA work-up and ended up being prescribed B12 supplements.  He said he has been feeling his normal self and doing well prior to this time.  He reports that he had no difficulty with speech or dizziness.  No loss of bowel or bladder control.  He reported left high vision was blurry compared to right during the episode.  He reports that he had not been using any type of recreational drugs.  ED Course: Code stroke was called in the emergency department and he was evaluated by Dr. Wilford Corner.  He had a CT brain that did not show any acute findings.  He was prescribed a migraine cocktail.  Fortunately it did improve his headache symptoms however the paresthesias and left-sided symptoms did not improve.  The recommendation was to obtain an MRI to rule out a small lacunar infarct.  If MRI is  positive likely would need a neurological consultation.  Review of Systems: Review of Systems  Constitutional: Negative.   HENT: Negative.    Eyes:  Positive for blurred vision. Negative for photophobia, pain, discharge and redness.  Respiratory: Negative.    Cardiovascular: Negative.   Gastrointestinal: Negative.   Genitourinary: Negative.   Musculoskeletal: Negative.   Skin: Negative.   Neurological:  Positive for tingling, sensory change, weakness and headaches. Negative for dizziness, seizures and loss of consciousness.  Endo/Heme/Allergies: Negative.   Psychiatric/Behavioral: Negative.    All other systems reviewed and are negative.   Past Medical History:  Diagnosis Date   Hypertension    MI (myocardial infarction) [facility])     Past Surgical History:  Procedure Laterality Date   HERNIA REPAIR     KNEE SURGERY     SHOULDER SURGERY       reports that he has never smoked. He has never used smokeless tobacco. He reports that he does not currently use alcohol. He reports that he does not currently use drugs.  Allergies  Allergen Reactions   Chocolate Rash   Hydrochlorothiazide Rash   Lisinopril Rash   Penicillins Rash    No family history on file.  Prior to Admission medications   Medication Sig Start Date End Date Taking? Authorizing Provider  ibuprofen (MOTRIN IB) 200 MG tablet Take 1 tablet (200 mg total) by mouth every 8 (eight) hours as needed for up to 20 doses for mild pain. 04/01/21  Yes Elgergawy, Leana Roe,  MD  vitamin B-12 (CYANOCOBALAMIN) 500 MCG tablet Take 1 tablet (500 mcg total) by mouth daily. 04/01/21  Yes Elgergawy, Leana Roe, MD  acetaminophen (TYLENOL) 325 MG tablet Take 2 tablets (650 mg total) by mouth every 6 (six) hours as needed for mild pain. Patient not taking: Reported on 05/13/2021 04/01/21   Elgergawy, Leana Roe, MD    Physical Exam: Vitals:   05/13/21 1230 05/13/21 1300 05/13/21 1330 05/13/21 1400  BP: 131/81 136/86 134/82 124/83  Pulse:  (!) 52 65 (!) 55 (!) 56  Resp: 12 15 14 11   Temp:      TempSrc:      SpO2: 100% 99% 100% 100%    Constitutional: awake, alert, cooperative, NAD, calm, comfortable Eyes: PERRL, lids and conjunctivae normal ENMT: Mucous membranes are moist. Posterior pharynx clear of any exudate or lesions.Normal dentition.  Neck: normal, supple, no masses, no thyromegaly Respiratory: clear to auscultation bilaterally, no wheezing, no crackles. Normal respiratory effort. No accessory muscle use.  Cardiovascular: normal s1, s2 sounds, no murmurs / rubs / gallops. No extremity edema. 2+ pedal pulses. No carotid bruits.  Abdomen: no tenderness, no masses palpated. No hepatosplenomegaly. Bowel sounds positive.  Musculoskeletal: no clubbing / cyanosis. No joint deformity upper and lower extremities. Good ROM, no contractures. Normal muscle tone.  Skin: no rashes, lesions, ulcers. No induration Neurologic: CN 2-12 grossly intact. Diminished sensation LUE/LLE, DTR normal. Strength 5/5 in all 4.  Psychiatric: Normal judgment and insight. Alert and oriented x 3. Normal mood.   Labs on Admission: I have personally reviewed following labs and imaging studies  CBC: Recent Labs  Lab 05/13/21 0838 05/13/21 0845  WBC 3.7*  --   NEUTROABS 1.9  --   HGB 14.2 14.6  HCT 42.8 43.0  MCV 103.4*  --   PLT 191  --    Basic Metabolic Panel: Recent Labs  Lab 05/13/21 0838 05/13/21 0845  NA 137 139  K 4.2 4.2  CL 102 102  CO2 29  --   GLUCOSE 103* 98  BUN 15 15  CREATININE 1.19 1.30*  CALCIUM 9.5  --    GFR: CrCl cannot be calculated (Unknown ideal weight.). Liver Function Tests: Recent Labs  Lab 05/13/21 0838  AST 16  ALT 15  ALKPHOS 91  BILITOT 1.0  PROT 7.3  ALBUMIN 4.0   No results for input(s): LIPASE, AMYLASE in the last 168 hours. No results for input(s): AMMONIA in the last 168 hours. Coagulation Profile: Recent Labs  Lab 05/13/21 0838  INR 1.0   Cardiac Enzymes: No results for  input(s): CKTOTAL, CKMB, CKMBINDEX, TROPONINI in the last 168 hours. BNP (last 3 results) No results for input(s): PROBNP in the last 8760 hours. HbA1C: No results for input(s): HGBA1C in the last 72 hours. CBG: Recent Labs  Lab 05/13/21 0821  GLUCAP 69*   Lipid Profile: No results for input(s): CHOL, HDL, LDLCALC, TRIG, CHOLHDL, LDLDIRECT in the last 72 hours. Thyroid Function Tests: No results for input(s): TSH, T4TOTAL, FREET4, T3FREE, THYROIDAB in the last 72 hours. Anemia Panel: No results for input(s): VITAMINB12, FOLATE, FERRITIN, TIBC, IRON, RETICCTPCT in the last 72 hours. Urine analysis:    Component Value Date/Time   COLORURINE YELLOW 03/30/2021 2208   APPEARANCEUR CLEAR 03/30/2021 2208   LABSPEC 1.014 03/30/2021 2208   PHURINE 6.0 03/30/2021 2208   GLUCOSEU NEGATIVE 03/30/2021 2208   HGBUR NEGATIVE 03/30/2021 2208   BILIRUBINUR NEGATIVE 03/30/2021 2208   KETONESUR NEGATIVE 03/30/2021 2208   PROTEINUR  NEGATIVE 03/30/2021 2208   NITRITE NEGATIVE 03/30/2021 2208   LEUKOCYTESUR NEGATIVE 03/30/2021 2208    Radiological Exams on Admission: CT HEAD CODE STROKE WO CONTRAST  Addendum Date: 05/13/2021   ADDENDUM REPORT: 05/13/2021 09:02 ADDENDUM: Study discussed by telephone with Dr. Coralee Pesa on 05/13/2021 at 0851 hours. Electronically Signed   By: Odessa Fleming M.D.   On: 05/13/2021 09:02   Result Date: 05/13/2021 CLINICAL DATA:  Code stroke. 55 year old male sudden onset numbness, stroke symptoms. EXAM: CT HEAD WITHOUT CONTRAST TECHNIQUE: Contiguous axial images were obtained from the base of the skull through the vertex without intravenous contrast. COMPARISON:  Brain MRI 03/31/2021. CTA head and neck 03/30/2021, head CT. 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 Elsewhere stable and normal gray-white matter differentiation throughout the brain. No midline shift, ventriculomegaly, mass effect, evidence of mass lesion,  intracranial hemorrhage or evidence of cortically based acute infarction. Gray-white matter differentiation is within normal limits throughout the brain. Vascular: No suspicious intracranial vascular hyperdensity. Skull: Negative. Sinuses/Orbits: Visualized paranasal sinuses and mastoids are stable and well aerated. Other: Visualized orbits and scalp soft tissues are within normal limits. ASPECTS [facility] Stroke Program Early CT Score) Total score (0-10 with 10 being normal): 10 IMPRESSION: 1. No acute cortically based infarct or acute intracranial hemorrhage identified. ASPECTS 10. 2. Negative noncontrast CT appearance of the brain aside from a subtle chronic left caudate lacune. Electronically Signed: By: Odessa Fleming M.D. On: 05/13/2021 08:49    EKG: Independently reviewed. Normal sinus rhythm   Assessment/Plan Principal Problem:   Left-sided weakness Active Problems:   Numbness and tingling of left arm and leg   Essential hypertension   History of MI (myocardial infarction)   Migraine headache   Complex migraine headache -Fortunately patient responding well to migraine cocktail given in the emergency department -parasthesia symptoms persist despite headache resolving  Acute left sided weakness  -Symptoms persist despite headache resolving -Per neuro recommendations obtain MRI brain without contrast -Rule out lacunar infarct -TIA work-up -Aspirin 81 mg ordered -Check lipid panel in a.m. -Recent 2D echo reviewed from October 2022 -If MRI positive would obtain neuro consult -Further recommendations to follow  DVT prophylaxis: enoxaparin   Code Status: Full   Family Communication: bedside update   Disposition Plan: home   Consults called: neurology (telehealth)  Admission status: OBV   Level of care: Telemetry Standley Dakins MD [facility] How to contact the [facility] Attending or Consulting provider 7A - 7P or covering provider during after hours 7P -7A, for this patient?  Check  the care team in [facility] and look for a) attending/consulting [facility] provider listed and b) the [facility] team listed Log into [email or web address] and use [place]'s universal password to access. If you do not have the password, please contact the hospital operator. Locate the [facility] provider you are looking for under [facility] and page to a number that you can be directly reached. If you still have difficulty reaching the provider, please page the [facility] (Director on Call) for the Hospitalists listed on amion for assistance.   If 7PM-7AM, please contact night-coverage [email or web address] Password [facility]  05/13/2021, 2:37 PM

## 2021-05-13 NOTE — ED Notes (Signed)
Phrmacy aware of stroke book need

## 2021-05-13 NOTE — ED Notes (Signed)
Printed paper results given to Dr. , I Stat not crossing over.

## 2021-05-13 NOTE — ED Triage Notes (Addendum)
Pt c/o left arm and leg weakness, numbness and tingling along with blurred vision in both eyes, but more in left eye that started suddenly at 0630. Pt woke up at 0400 this morning and felt normal. Pt also c/o left sided headache.

## 2021-05-13 NOTE — ED Notes (Signed)
Pt verbalized tingling, needles at tip of left foot all 5 toes. Headache is gone and blurriness is better.

## 2021-05-13 NOTE — ED Notes (Signed)
Pt in CT.

## 2021-05-13 NOTE — ED Provider Notes (Signed)
[facility] EMERGENCY DEPARTMENT Provider Note   CSN: [id number] Arrival date & time: 05/13/21  [id number]  An emergency department physician performed an initial assessment on this suspected stroke patient at 0830.  History Chief Complaint  Patient presents with   Code Stroke    Marcus Dunn is a 55 y.o. male.  HPI  55 year old male with past medical history of HTN, CAD, TIA presents emergency department with left-sided symptoms.  Patient states that he got up at 4 AM, felt baseline.  Around 6:30 AM while he was eating breakfast he started to feel weakness and numbness and tingling in the left side of his body involving the upper and lower extremity.  He also felt like the left side of his face was unusual.  He developed a headache and blurred vision in both eyes.  Denies any vision loss, facial droop, slurred speech.  He feels like he was drooling at breakfast.  He has had the symptoms once before about a month ago, was prescribed B12 vitamins.  Does not believe he was diagnosed with a stroke.  Denies any dizziness, speech difficulty.  Patient seen as a code stroke.  Past Medical History:  Diagnosis Date   Hypertension    MI (myocardial infarction) [facility])     Patient Active Problem List   Diagnosis Date Noted   Syncope 03/31/2021   Numbness and tingling of left arm and leg 03/31/2021   Chest pain 03/31/2021   Essential hypertension 03/31/2021    Past Surgical History:  Procedure Laterality Date   HERNIA REPAIR     KNEE SURGERY     SHOULDER SURGERY         No family history on file.  Social History   Tobacco Use   Smoking status: Never   Smokeless tobacco: Never  Vaping Use   Vaping Use: Never used  Substance Use Topics   Alcohol use: Not Currently   Drug use: Not Currently    Home Medications Prior to Admission medications   Medication Sig Start Date End Date Taking? Authorizing Provider  acetaminophen (TYLENOL) 325 MG tablet Take 2 tablets (650 mg total) by  mouth every 6 (six) hours as needed for mild pain. 04/01/21   Elgergawy, Silver Huguenin, MD  ibuprofen (MOTRIN IB) 200 MG tablet Take 1 tablet (200 mg total) by mouth every 8 (eight) hours as needed for up to 20 doses for mild pain. 04/01/21   Elgergawy, Silver Huguenin, MD  vitamin B-12 (CYANOCOBALAMIN) 500 MCG tablet Take 1 tablet (500 mcg total) by mouth daily. 04/01/21   Elgergawy, Silver Huguenin, MD    Allergies    Chocolate, Hydrochlorothiazide, Lisinopril, and Penicillins  Review of Systems   Review of Systems  Constitutional:  Negative for chills and fever.  HENT:  Negative for congestion.   Eyes:  Negative for visual disturbance.  Respiratory:  Negative for shortness of breath.   Cardiovascular:  Negative for chest pain.  Gastrointestinal:  Negative for abdominal pain, diarrhea and vomiting.  Genitourinary:  Negative for dysuria.  Musculoskeletal:  Positive for neck pain.  Skin:  Negative for rash.  Neurological:  Positive for dizziness, facial asymmetry, weakness, numbness and headaches. Negative for tremors, syncope, speech difficulty and light-headedness.   Physical Exam Updated Vital Signs BP (!) 145/77   Pulse 61   Temp 98.6 F (37 C) (Oral)   Resp 18   SpO2 99%   Physical Exam Vitals and nursing note reviewed.  Constitutional:      Appearance:  Normal appearance. He is not diaphoretic.  HENT:     Head: Normocephalic.     Mouth/Throat:     Mouth: Mucous membranes are moist.  Eyes:     Extraocular Movements: Extraocular movements intact.     Pupils: Pupils are equal, round, and reactive to light.  Cardiovascular:     Rate and Rhythm: Normal rate.  Pulmonary:     Effort: Pulmonary effort is normal. No respiratory distress.  Abdominal:     Palpations: Abdomen is soft.     Tenderness: There is no abdominal tenderness.  Musculoskeletal:        General: No deformity.     Cervical back: No rigidity or tenderness.  Skin:    General: Skin is warm.  Neurological:     Mental Status:  He is alert and oriented to person, place, and time.     Comments: Possible slight left-sided facial droop, mild weakness in the left upper and lower extremity, subjective numbness in the left upper and lower extremity, fluent speech, subjective blurred vision in both eyes/full visual fields  Psychiatric:        Mood and Affect: Mood normal.    ED Results / Procedures / Treatments   Labs (all labs ordered are listed, but only abnormal results are displayed) Labs Reviewed  CBC - Abnormal; Notable for the following components:      Result Value   WBC 3.7 (*)    RBC 4.14 (*)    MCV 103.4 (*)    MCH 34.3 (*)    All other components within normal limits  COMPREHENSIVE METABOLIC PANEL - Abnormal; Notable for the following components:   Glucose, Bld 103 (*)    All other components within normal limits  CBG MONITORING, ED - Abnormal; Notable for the following components:   Glucose-Capillary 69 (*)    All other components within normal limits  I-STAT CHEM 8, ED - Abnormal; Notable for the following components:   Creatinine, Ser 1.30 (*)    All other components within normal limits  PROTIME-INR  APTT  DIFFERENTIAL  CBG MONITORING, ED    EKG None  Radiology CT HEAD CODE STROKE WO CONTRAST  Addendum Date: 05/13/2021   ADDENDUM REPORT: 05/13/2021 09:02 ADDENDUM: Study discussed by telephone with Dr. Lavenia Atlas on 05/13/2021 at 0851 hours. Electronically Signed   By: Genevie Ann M.D.   On: 05/13/2021 09:02   Result Date: 05/13/2021 CLINICAL DATA:  Code stroke. 55 year old male sudden onset numbness, stroke symptoms. EXAM: CT HEAD WITHOUT CONTRAST TECHNIQUE: Contiguous axial images were obtained from the base of the skull through the vertex without intravenous contrast. COMPARISON:  Brain MRI 03/31/2021. CTA head and neck 03/30/2021, head CT. 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 Elsewhere stable and normal gray-white matter  differentiation throughout the brain. No midline shift, ventriculomegaly, mass effect, evidence of mass lesion, intracranial hemorrhage or evidence of cortically based acute infarction. Gray-white matter differentiation is within normal limits throughout the brain. Vascular: No suspicious intracranial vascular hyperdensity. Skull: Negative. Sinuses/Orbits: Visualized paranasal sinuses and mastoids are stable and well aerated. Other: Visualized orbits and scalp soft tissues are within normal limits. ASPECTS [facility] Stroke Program Early CT Score) Total score (0-10 with 10 being normal): 10 IMPRESSION: 1. No acute cortically based infarct or acute intracranial hemorrhage identified. ASPECTS 10. 2. Negative noncontrast CT appearance of the brain aside from a subtle chronic left caudate lacune. Electronically Signed: By: Herminio Heads.D.  On: 05/13/2021 08:49    Procedures .Critical Care Performed by: Rozelle Logan, DO Authorized by: Rozelle Logan, DO   Critical care provider statement:    Critical care time (minutes):  45   Critical care time was exclusive of:  Separately billable procedures and treating other patients   Critical care was necessary to treat or prevent imminent or life-threatening deterioration of the following conditions:  CNS failure or compromise   Critical care was time spent personally by me on the following activities:  Development of treatment plan with patient or surrogate, discussions with consultants, evaluation of patient's response to treatment, examination of patient, ordering and review of laboratory studies, ordering and review of radiographic studies, ordering and performing treatments and interventions, pulse oximetry, re-evaluation of patient's condition and review of old charts   I assumed direction of critical care for this patient from another provider in my specialty: no     Care discussed with: admitting provider     Medications Ordered in ED Medications   ketorolac (TORADOL) 30 MG/ML injection 30 mg (has no administration in time range)  metoCLOPramide (REGLAN) injection 5 mg (has no administration in time range)  diphenhydrAMINE (BENADRYL) injection 12.5 mg (has no administration in time range)  sodium chloride 0.9 % bolus 1,000 mL (has no administration in time range)  sodium chloride flush (NS) 0.9 % injection 3 mL (3 mLs Intravenous Given by Other 05/13/21 0848)    ED Course  I have reviewed the triage vital signs and the nursing notes.  Pertinent labs & imaging results that were available during my care of the patient were reviewed by me and considered in my medical decision making (see chart for details).    MDM Rules/Calculators/A&P                           55 year old male presents emergency department sudden onset left-sided weakness/numbness and tingling, blurred vision.  Seen as a code stroke in conjunction with teleneurology.  Fingerstick glucose was normal, vitals are stable on arrival.  Patient had similar symptoms back in October/2022, had a stroke work-up at that time that was negative.  Head CT was negative, shows chronic findings.  Teleneurology has evaluated the patient, Dr. Wilford Corner recommends treating for complicated migraine and reevaluating.  On reevaluation patient's headache is improved however his numbness and tingling and otherwise left-sided symptoms persist.  Plan to admit for MRI tomorrow morning as per neurology recommendations.  Otherwise patient is stable at time of admission.  Patients evaluation and results requires admission for further treatment and care. Patient agrees with admission plan, offers no new complaints and is stable/unchanged at time of admit.  Final Clinical Impression(s) / ED Diagnoses Final diagnoses:  None    Rx / DC Orders ED Discharge Orders     None        Rozelle Logan, DO 05/13/21 1508

## 2021-05-13 NOTE — ED Notes (Signed)
Phlebotomist arrived at ED room at 0828 but pt was already in CT. Phlebotomy went to CT and drew blood at 0835.

## 2021-05-13 NOTE — Consult Note (Signed)
[facility] Telemedicine Consult   Requesting Provider: Dr. Wilkie Aye Consult Participants: Dr. Marthe Patch, Telespecialist RN Byrd Hesselbach   bedside RN-Hannah, Bonita Quin Location of the provider: [facility] Location of the patient: [facility] room bed 6  This consult was provided via telemedicine with 2-way video and audio communication. The patient/family was informed that care would be provided in this way and agreed to receive care in this manner.    Chief Complaint: Left-sided tingling and numbness  HPI: 55 year old inmate, with history of hypertension and prior history of MI, presented to the emergency room for evaluation of sudden onset of left-sided tingling and numbness.  Woke up at 4 AM this morning in his usual state of health.  Around 6:30 AM noted that left side was feeling tingly-both arm and leg.  Also reported left head feeling a abnormal sensation of blood gushing.  Also reports blurred vision worse from the left eye in comparison to the right eye. Denies history of migraines in the past. Was seen in October 2022 with chest pain, shakiness diaphoresis and a syncopal event along with then onset of tingling in the left hand and foot that had been going on at that time for a while.  His tingling and numbness was more pronounced after a syncopal event last time. Today, he has not had syncope.    Past Medical History:  Diagnosis Date   Hypertension    MI (myocardial infarction) (HCC)      Current Facility-Administered Medications:    sodium chloride flush (NS) 0.9 % injection 3 mL, 3 mL, Intravenous, Once, Horton, Kristie M, DO  Current Outpatient Medications:    acetaminophen (TYLENOL) 325 MG tablet, Take 2 tablets (650 mg total) by mouth every 6 (six) hours as needed for mild pain., Disp: , Rfl:    ibuprofen (MOTRIN IB) 200 MG tablet, Take 1 tablet (200 mg total) by mouth every 8 (eight) hours as needed for up to 20 doses for mild pain., Disp: 20 tablet, Rfl: 0   vitamin  B-12 (CYANOCOBALAMIN) 500 MCG tablet, Take 1 tablet (500 mcg total) by mouth daily., Disp: , Rfl:     LKW: 6:20 AM tpa given?: No, likely complex migraine IR Thrombectomy? No, exam not consistent with LVO Modified Rankin Scale: 0-Completely asymptomatic and back to baseline post- stroke Time of teleneurologist evaluation: 8:29 AM  Exam: Vitals:   05/13/21 0856 05/13/21 0900  BP:  (!) 145/77  Pulse: 80 61  Resp: 15 18  Temp:    SpO2: 100% 99%    General: Awake alert oriented x3 Neurological exam Awake alert oriented x3 No dysarthria No aphasia Cranial nerves: Pupils equal round react light, extraocular movements intact, visual fields full, facial sensation-mildly diminished on the left side, face is symmetric, tongue and palate midline. Motor examination with no weakness and no drift Sensory exam with diminished sensation on the left hemibody without extinction Coordination with no dysmetria Reports diminished visual acuity on the left eye-when showing him pictures of what migraine aura looks like he said it resembles that without too many colors.  NIHSS 1A: Level of Consciousness - 0 1B: Ask Month and Age - 0 1C: 'Blink Eyes' & 'Squeeze Hands' - 0 2: Test Horizontal Extraocular Movements - 0 3: Test Visual Fields - 0 4: Test Facial Palsy - 0 5A: Test Left Arm Motor Drift - 0 5B: Test Right Arm Motor Drift - 0 6A: Test Left Leg Motor Drift - 0 6B: Test Right Leg Motor Drift - 0  7: Test Limb Ataxia - 0 8: Test Sensation - 2 9: Test Language/Aphasia- 0 10: Test Dysarthria - 0 11: Test Extinction/Inattention - 0 NIHSS score: 2   Imaging Reviewed: CT head with no acute changes.  Aspects 10. MRI from October-no acute changes. CTA head and neck from October-no LVO.  No large vessel occlusion, no hemodynamically significant stenosis or other vascular abnormality.  Labs reviewed in epic and pertinent values follow: CBC    Component Value Date/Time   WBC 3.7 (L)  04/01/2021 0050   RBC 3.85 (L) 04/01/2021 0050   HGB 13.2 04/01/2021 0050   HCT 39.1 04/01/2021 0050   PLT 189 04/01/2021 0050   MCV 101.6 (H) 04/01/2021 0050   MCH 34.3 (H) 04/01/2021 0050   MCHC 33.8 04/01/2021 0050   RDW 11.3 (L) 04/01/2021 0050   LYMPHSABS 1.7 03/30/2021 2110   MONOABS 0.4 03/30/2021 2110   EOSABS 0.3 03/30/2021 2110   BASOSABS 0.0 03/30/2021 2110   CMP     Component Value Date/Time   NA 137 04/01/2021 0050   K 3.8 04/01/2021 0050   CL 105 04/01/2021 0050   CO2 25 04/01/2021 0050   GLUCOSE 94 04/01/2021 0050   BUN 12 04/01/2021 0050   CREATININE 1.24 04/01/2021 0050   CALCIUM 8.8 (L) 04/01/2021 0050   PROT 6.7 03/31/2021 0545   ALBUMIN 3.8 03/31/2021 0545   AST 14 (L) 03/31/2021 0545   ALT 14 03/31/2021 0545   ALKPHOS 80 03/31/2021 0545   BILITOT 1.4 (H) 03/31/2021 0545   GFRNONAA >60 04/01/2021 0050     Assessment: 55 year old man with sudden onset of left-sided tingling and numbness along with blurred vision as well as left-sided headache. The symptoms have happened in October as well with negative work-up thus far. Blurred vision and unilateral paresthesias along with headache likely more consistent with a complex migraine. Less likely stroke. IV thrombolysis not considered due to the complex migraine be more likely as well as low stroke scale. No LVO signs on exam  Recommendations:  -Migraine cocktail-Toradol Benadryl Reglan IV.  Can repeat in 6 hours if no relief. -If continues to have persistent symptoms, should get an MRI to rule out a small lacunar infarct.  If the MRI is positive for stroke, please obtain a routine neurological consultation. -No emergent indication for transfer to the tertiary care center at this time.  Discussed with ED provider Dr. Dina Rich over the phone.  This patient is receiving care for possible acute neurological changes. There was 41 minutes of care by this provider at the time of service, including time for direct  evaluation via telemedicine, review of medical records, imaging studies and discussion of findings with providers, the patient and/or family.  -- Amie Portland, MD [facility] Pager: [phone] If 7pm to 7am, please call on call as listed on [facility]ION.

## 2021-05-13 NOTE — ED Notes (Signed)
Pt returned to ED room from CT.  °

## 2021-05-14 ENCOUNTER — Observation Stay (HOSPITAL_COMMUNITY)

## 2021-05-14 DIAGNOSIS — I252 Old myocardial infarction: Secondary | ICD-10-CM | POA: Diagnosis not present

## 2021-05-14 DIAGNOSIS — I10 Essential (primary) hypertension: Secondary | ICD-10-CM | POA: Diagnosis not present

## 2021-05-14 DIAGNOSIS — R531 Weakness: Secondary | ICD-10-CM | POA: Diagnosis not present

## 2021-05-14 DIAGNOSIS — R200 Anesthesia of skin: Secondary | ICD-10-CM | POA: Diagnosis not present

## 2021-05-14 LAB — LIPID PANEL
Cholesterol: 188 mg/dL (ref 0–200)
HDL: 53 mg/dL (ref >40)
LDL Cholesterol: 124 mg/dL — ABNORMAL HIGH (ref 0–99)
Total CHOL/HDL Ratio: 3.5 RATIO
Triglycerides: 56 mg/dL (ref <150)
VLDL: 11 mg/dL (ref 0–40)

## 2021-05-14 IMAGING — MR MR HEAD W/O CM
11 of 12 series · 41 of 48 positions shown · non-contrast
Comparison: MRI [DATE].  CT head [DATE].

CLINICAL DATA: Headache, chronic, no new features Neuro deficit,
acute, stroke suspected

EXAM:
MRI HEAD WITHOUT CONTRAST
TECHNIQUE: Multiplanar, multiecho pulse sequences of the brain and surrounding
structures were obtained without intravenous contrast.

[Series 5: DWI · axial · 4.0mm · 0.88mm/px · z∈[-58,+81]mm · 4 of 36 slices shown (1 of 6)]
[im 1/36]
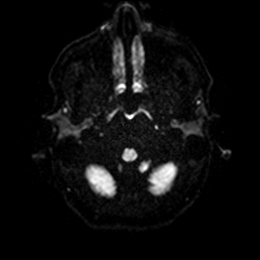
[im 12/36]
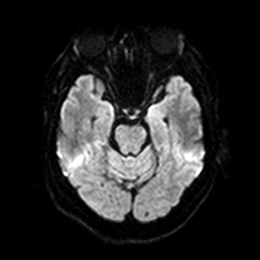
[im 24/36]
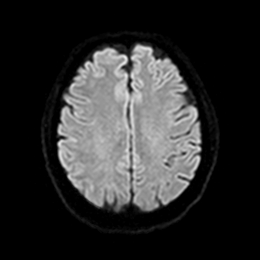
[im 36/36]
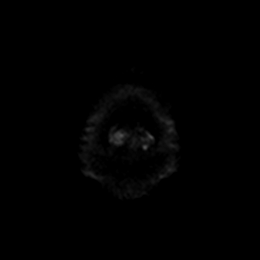

[Series 5: DWI · axial · 4.0mm · 0.88mm/px · z∈[-58,+81]mm · 4 of 36 slices shown (2 of 6)]
[im 1/36]
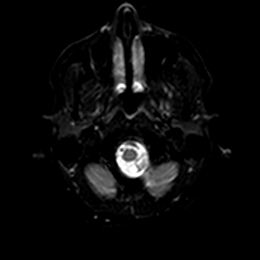
[im 12/36]
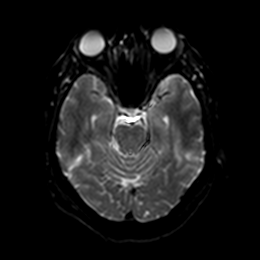
[im 24/36]
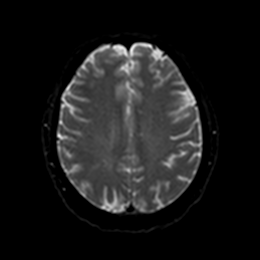
[im 36/36]
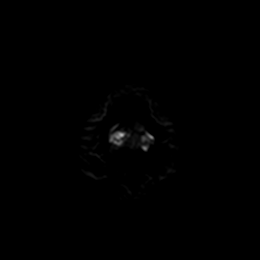

[Series 6: DWI · axial · 4.0mm · 0.88mm/px · z∈[-58,+81]mm · 4 of 36 slices shown (3 of 6)]
[im 1/36]
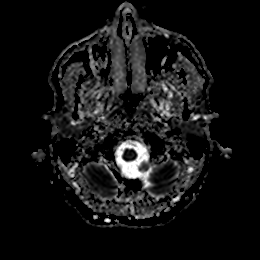
[im 12/36]
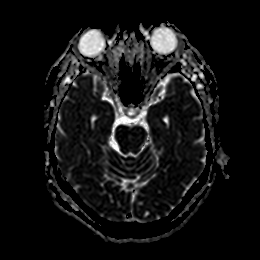
[im 24/36]
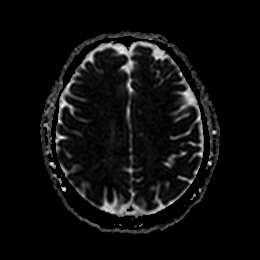
[im 36/36]
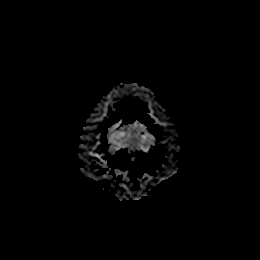

[Series 7: DWI · coronal · 5.0mm · 0.88mm/px · 3 of 29 slices shown (4 of 6)]
[im 1/29]
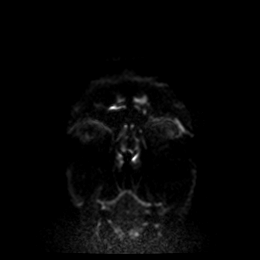
[im 15/29]
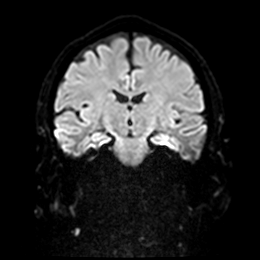
[im 29/29]
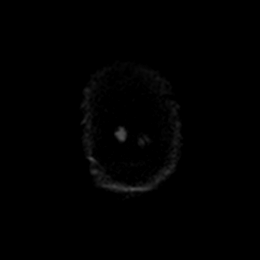

[Series 7: DWI · coronal · 5.0mm · 0.88mm/px · 4 of 29 slices shown (5 of 6)]
[im 1/29]
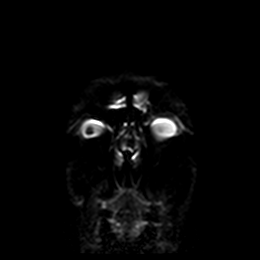
[im 10/29]
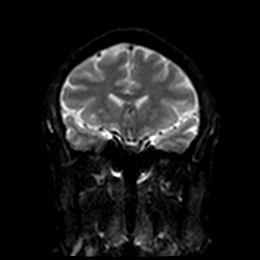
[im 19/29]
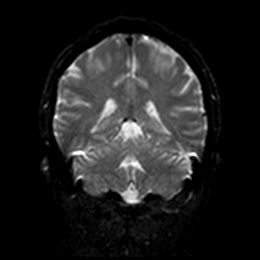
[im 29/29]
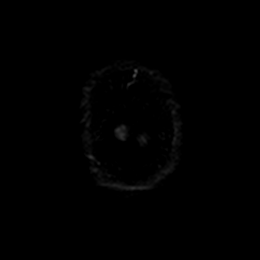

[Series 8: DWI · coronal · 5.0mm · 0.88mm/px · 4 of 29 slices shown (6 of 6)]
[im 1/29]
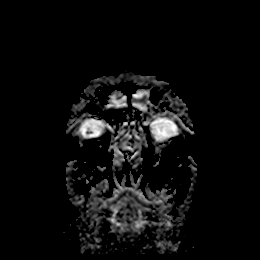
[im 10/29]
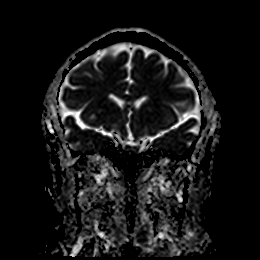
[im 19/29]
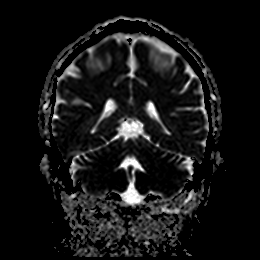
[im 29/29]
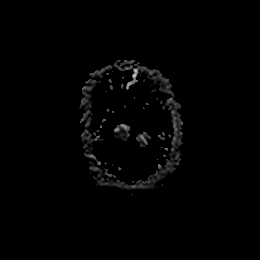

[Series 9: T1 · sagittal · 5.0mm · 0.94mm/px · 3 of 23 slices shown]
[im 1/23]
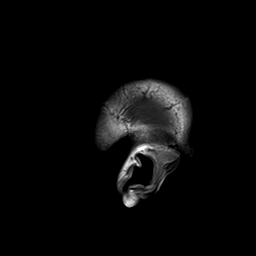
[im 12/23]
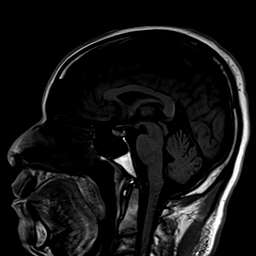
[im 23/23]
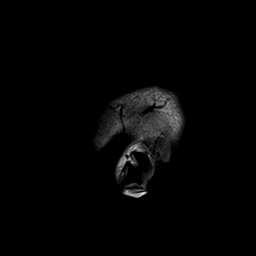

[Series 10: T2 · axial · 5.0mm · 0.72mm/px · z∈[-65,+81]mm · 3 of 22 slices shown (1 of 2)]
[im 1/22]
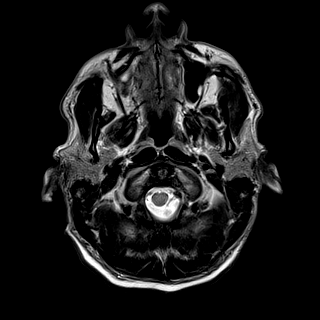
[im 11/22]
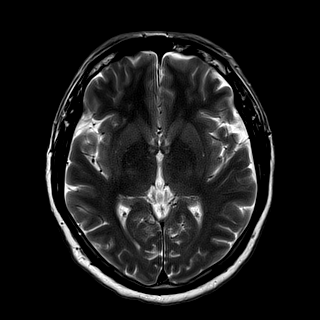
[im 22/22]
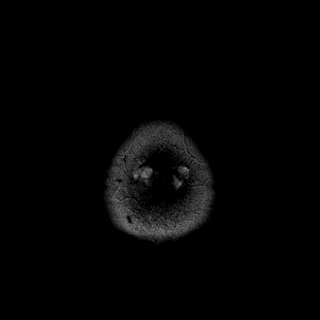

[Series 11: ax hemo · axial · 5.0mm · 0.86mm/px · z∈[-57,+86]mm · 3 of 25 slices shown]
[im 1/25]
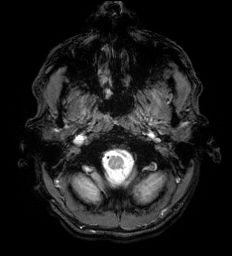
[im 13/25]
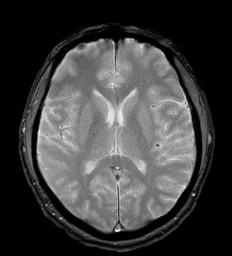
[im 25/25]
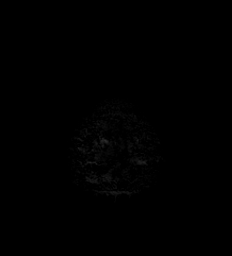

[Series 12: FLAIR · axial · 4.0mm · 0.43mm/px · z∈[-54,+90]mm · 5 of 37 slices shown]
[im 1/37]
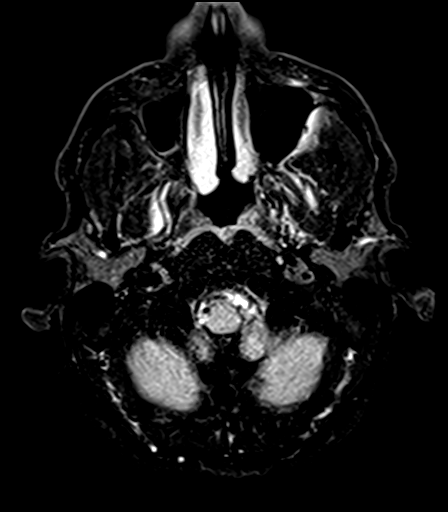
[im 10/37]
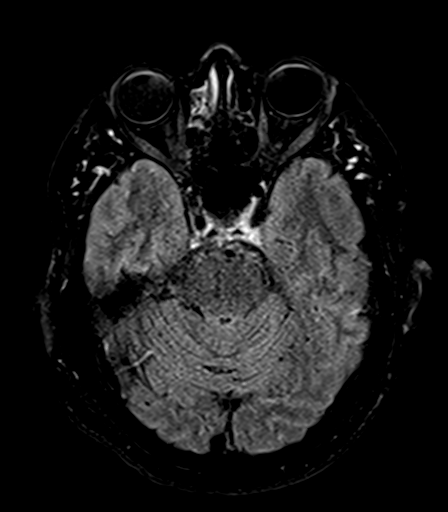
[im 19/37]
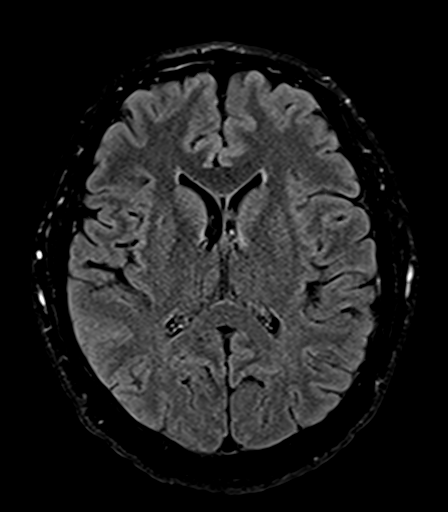
[im 28/37]
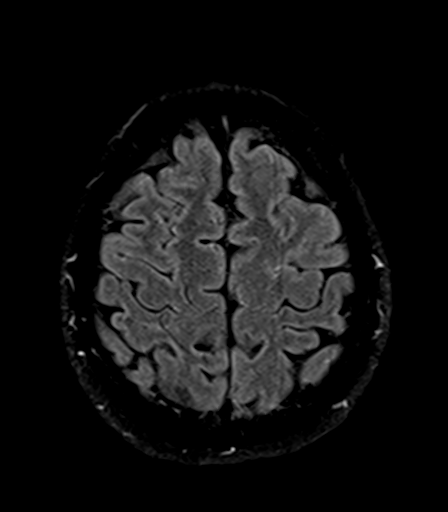
[im 37/37]
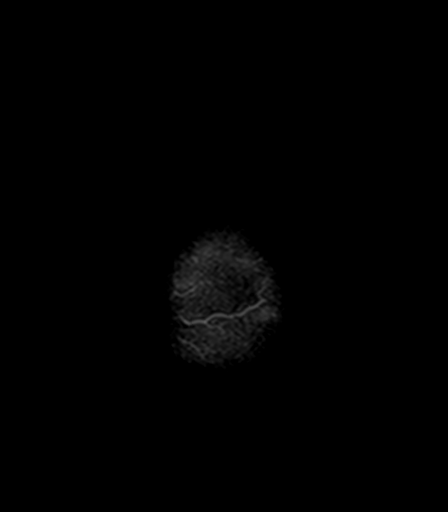

[Series 14: T2 · coronal · 5.0mm · 0.72mm/px · 4 of 28 slices shown (2 of 2)]
[im 1/28]
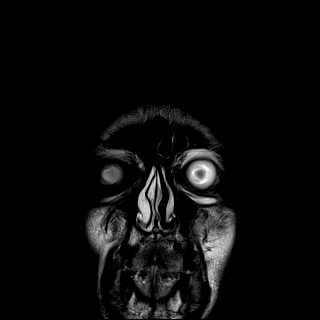
[im 10/28]
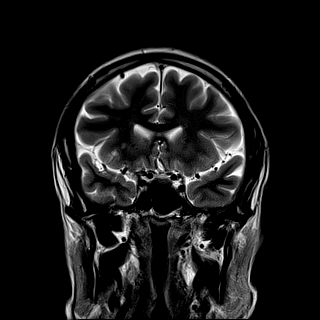
[im 19/28]
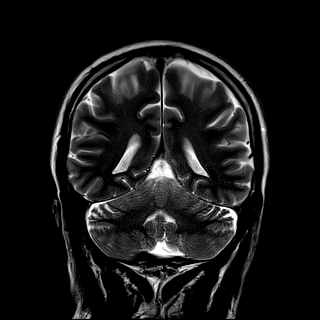
[im 28/28]
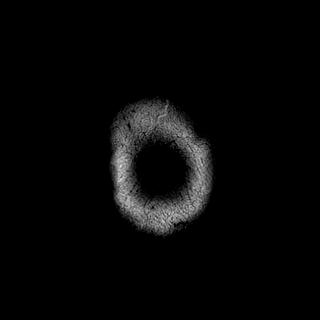

[41 of 48 positions shown; findings below may reference images not displayed]

FINDINGS: Brain: No acute infarction, hemorrhage, hydrocephalus, extra-axial
collection or mass lesion.

Vascular: Major arterial flow voids are maintained at the skull
base.

Skull and upper cervical spine: Normal marrow signal.

Sinuses/Orbits: Mild paranasal sinus mucosal thickening.
Unremarkable orbits.

Other: No mastoid effusions.
IMPRESSION: No evidence of acute intracranial abnormality.

## 2021-05-14 MED ORDER — DIPHENHYDRAMINE HCL 50 MG/ML IJ SOLN
12.5000 mg | Freq: Once | INTRAMUSCULAR | Status: AC
  Administered 2021-05-14: 12.5 mg via INTRAVENOUS
  Filled 2021-05-14: qty 1

## 2021-05-14 MED ORDER — ATORVASTATIN CALCIUM 10 MG PO TABS: 10.0000 mg | ORAL_TABLET | Freq: Every evening | ORAL | 11 refills | Status: DC

## 2021-05-14 MED ORDER — ASPIRIN 81 MG PO TBEC
81.0000 mg | DELAYED_RELEASE_TABLET | Freq: Every day | ORAL | 11 refills | Status: AC
Start: 2021-05-15 — End: ?

## 2021-05-14 MED ORDER — OMEPRAZOLE MAGNESIUM 20 MG PO TBEC: 20.0000 mg | DELAYED_RELEASE_TABLET | Freq: Every day | ORAL | Status: AC

## 2021-05-14 MED ORDER — METOCLOPRAMIDE HCL 5 MG/ML IJ SOLN
5.0000 mg | Freq: Once | INTRAMUSCULAR | Status: AC
  Administered 2021-05-14: 5 mg via INTRAVENOUS
  Filled 2021-05-14: qty 2

## 2021-05-14 NOTE — Discharge Summary (Signed)
Physician Discharge Summary  KEATH MATERA ZOX:[id number] DOB: 02/16/1966 DOA: 05/13/2021   Admit date: 05/13/2021 Discharge date: 05/14/2021  Recommendations for Outpatient Follow-up:  Follow up with PCP in 2 weeks Establish care with outpatient neurologist in 1-2 months  Please check vitamin B12 level and lipid panel in 1-2 months   Discharge Condition: STABLE   CODE STATUS: FULL DIET: Heart healthy recommended    Brief Hospitalization Summary: Please see all hospital notes, images, labs for full details of the hospitalization. ADMISSION HPI: Marcus Dunn is a 55 y.o. male with medical history significant for hypertension, coronary artery disease status post MI, history of TIA, recent hospitalization for similar symptoms of left-sided paresthesias and numbness in suspected migraine headaches.  He presented to the emergency department after experiencing symptoms of weakness and numbness and tingling on the left side of the body involving the upper and lower extremity.  He also has some experience of left-sided facial numbness.  He reports that he woke up feeling normal and at baseline at 4 AM and around 6:30 AM after eating breakfast he started to feel the symptoms.  He also reported that he felt like he was drooling when he was eating his breakfast.  He reported that during his last hospitalization for similar symptoms he had a TIA work-up and ended up being prescribed B12 supplements.  He said he has been feeling his normal self and doing well prior to this time.  He reports that he had no difficulty with speech or dizziness.  No loss of bowel or bladder control.  He reported left high vision was blurry compared to right during the episode.  He reports that he had not been using any type of recreational drugs.   ED Course: Code stroke was called in the emergency department and he was evaluated by Dr. Wilford Corner.  He had a CT brain that did not show any acute findings.  He was prescribed a  migraine cocktail.  Fortunately it did improve his headache symptoms however the paresthesias and left-sided symptoms did not improve.  The recommendation was to obtain an MRI to rule out a small lacunar infarct.  If MRI is positive likely would need a neurological consultation.  HOSPITAL COURSE BY PROBLEM LIST   Complex migraine headache -Fortunately patient responded well to migraine cocktail and headache resolved  -parasthesia symptoms persist despite headache resolving but are nearly completely resolved -MRI brain with no acute findings.    Acute left sided weakness  -Symptoms persist despite headache resolving but nearly resolved - MRI brain without contrast again with no acute findings -TIA work-up has been unrevealing  -Aspirin 81 mg ordered -lipid panel with LDL 124 -Recent 2D echo reviewed from October 2022 -If MRI positive would obtain neuro consult -ambulatory referral for outpatient neurology follow up    Low normal B12  - continue oral vitamin B12 supplementation   LDL>70  Suboptimally controlled Atorvastatin 10 mg every evening  Recheck lipid panel in 1-2 months   Discharge Diagnoses:  Principal Problem:   Left-sided weakness Active Problems:   Numbness and tingling of left arm and leg   Essential hypertension   History of MI (myocardial infarction)   Migraine headache   Discharge Instructions: Discharge Instructions     Ambulatory referral to Neurology   Complete by: As directed    An appointment is requested in approximately: 8 weeks      Allergies as of 05/14/2021       Reactions   Chocolate  Rash   Hydrochlorothiazide Rash   Lisinopril Rash   Penicillins Rash        Medication List     STOP taking these medications    acetaminophen 325 MG tablet Commonly known as: TYLENOL       TAKE these medications    aspirin 81 MG EC tablet Take 1 tablet (81 mg total) by mouth daily. Swallow whole. Start taking on: May 15, 2021    atorvastatin 10 MG tablet Commonly known as: Lipitor Take 1 tablet (10 mg total) by mouth every evening.   ibuprofen 200 MG tablet Commonly known as: Motrin IB Take 1 tablet (200 mg total) by mouth every 8 (eight) hours as needed for up to 20 doses for mild pain.   omeprazole 20 MG tablet Commonly known as: PriLOSEC OTC Take 1 tablet (20 mg total) by mouth daily with breakfast.   vitamin B-12 500 MCG tablet Commonly known as: CYANOCOBALAMIN Take 1 tablet (500 mcg total) by mouth daily.        Allergies  Allergen Reactions   Chocolate Rash   Hydrochlorothiazide Rash   Lisinopril Rash   Penicillins Rash   Allergies as of 05/14/2021       Reactions   Chocolate Rash   Hydrochlorothiazide Rash   Lisinopril Rash   Penicillins Rash        Medication List     STOP taking these medications    acetaminophen 325 MG tablet Commonly known as: TYLENOL       TAKE these medications    aspirin 81 MG EC tablet Take 1 tablet (81 mg total) by mouth daily. Swallow whole. Start taking on: May 15, 2021   atorvastatin 10 MG tablet Commonly known as: Lipitor Take 1 tablet (10 mg total) by mouth every evening.   ibuprofen 200 MG tablet Commonly known as: Motrin IB Take 1 tablet (200 mg total) by mouth every 8 (eight) hours as needed for up to 20 doses for mild pain.   omeprazole 20 MG tablet Commonly known as: PriLOSEC OTC Take 1 tablet (20 mg total) by mouth daily with breakfast.   vitamin B-12 500 MCG tablet Commonly known as: CYANOCOBALAMIN Take 1 tablet (500 mcg total) by mouth daily.        Procedures/Studies: MR BRAIN WO CONTRAST  Result Date: 05/14/2021 CLINICAL DATA:  Headache, chronic, no new features Neuro deficit, acute, stroke suspected EXAM: MRI HEAD WITHOUT CONTRAST TECHNIQUE: Multiplanar, multiecho pulse sequences of the brain and surrounding structures were obtained without intravenous contrast. COMPARISON:  MRI March 31, 2021.  CT head  May 13, 2021. FINDINGS: Brain: No acute infarction, hemorrhage, hydrocephalus, extra-axial collection or mass lesion. Vascular: Major arterial flow voids are maintained at the skull base. Skull and upper cervical spine: Normal marrow signal. Sinuses/Orbits: Mild paranasal sinus mucosal thickening. Unremarkable orbits. Other: No mastoid effusions. IMPRESSION: No evidence of acute intracranial abnormality. Electronically Signed   By: Feliberto Harts M.D.   On: 05/14/2021 08:31   CT HEAD CODE STROKE WO CONTRAST  Addendum Date: 05/13/2021   ADDENDUM REPORT: 05/13/2021 09:02 ADDENDUM: Study discussed by telephone with Dr. Coralee Pesa on 05/13/2021 at 0851 hours. Electronically Signed   By: Odessa Fleming M.D.   On: 05/13/2021 09:02   Result Date: 05/13/2021 CLINICAL DATA:  Code stroke. 55 year old male sudden onset numbness, stroke symptoms. EXAM: CT HEAD WITHOUT CONTRAST TECHNIQUE: Contiguous axial images were obtained from the base of the skull through the vertex without intravenous contrast. COMPARISON:  Brain  MRI 03/31/2021. CTA head and neck 03/30/2021, head CT. 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 Elsewhere stable and normal gray-white matter differentiation throughout the brain. No midline shift, ventriculomegaly, mass effect, evidence of mass lesion, intracranial hemorrhage or evidence of cortically based acute infarction. Gray-white matter differentiation is within normal limits throughout the brain. Vascular: No suspicious intracranial vascular hyperdensity. Skull: Negative. Sinuses/Orbits: Visualized paranasal sinuses and mastoids are stable and well aerated. Other: Visualized orbits and scalp soft tissues are within normal limits. ASPECTS [facility] Stroke Program Early CT Score) Total score (0-10 with 10 being normal): 10 IMPRESSION: 1. No acute cortically based infarct or acute intracranial hemorrhage identified. ASPECTS 10. 2. Negative  noncontrast CT appearance of the brain aside from a subtle chronic left caudate lacune. Electronically Signed: By: Odessa Fleming M.D. On: 05/13/2021 08:49     Subjective: Pt reports only small amount of tingling left side of toes.   Discharge Exam: Vitals:   05/14/21 0703 05/14/21 0926  BP: 123/71 (!) 115/55  Pulse: 62 71  Resp: 18 20  Temp: 98 F (36.7 C) 98.1 F (36.7 C)  SpO2: 100% 100%   Vitals:   05/14/21 0302 05/14/21 0500 05/14/21 0703 05/14/21 0926  BP: 126/67 131/82 123/71 (!) 115/55  Pulse: 65 65 62 71  Resp: 17 17 18 20   Temp: 98 F (36.7 C) 97.9 F (36.6 C) 98 F (36.7 C) 98.1 F (36.7 C)  TempSrc: Oral Oral Oral Oral  SpO2: 99% 99% 100% 100%   General: Pt is alert, awake, not in acute distress Cardiovascular: normal S1/S2 +, no rubs, no gallops Respiratory: CTA bilaterally, no wheezing, no rhonchi Abdominal: Soft, NT, ND, bowel sounds + Extremities: no edema, no cyanosis Neurological no focal findings.    The results of significant diagnostics from this hospitalization (including imaging, microbiology, ancillary and laboratory) are listed below for reference.     Microbiology: Recent Results (from the past 240 hour(s))  Resp Panel by RT-PCR (Flu A&B, Covid) Nasopharyngeal Swab     Status: None   Collection Time: 05/13/21  2:08 PM   Specimen: Nasopharyngeal Swab; Nasopharyngeal(NP) swabs in vial transport medium  Result Value Ref Range Status   SARS Coronavirus 2 by RT PCR NEGATIVE NEGATIVE Final    Comment: (NOTE) SARS-CoV-2 target nucleic acids are NOT DETECTED.  The SARS-CoV-2 RNA is generally detectable in upper respiratory specimens during the acute phase of infection. The lowest concentration of SARS-CoV-2 viral copies this assay can detect is 138 copies/mL. A negative result does not preclude SARS-Cov-2 infection and should not be used as the sole basis for treatment or other patient management decisions. A negative result may occur with  improper  specimen collection/handling, submission of specimen other than nasopharyngeal swab, presence of viral mutation(s) within the areas targeted by this assay, and inadequate number of viral copies(<138 copies/mL). A negative result must be combined with clinical observations, patient history, and epidemiological information. The expected result is Negative.  Fact Sheet for Patients:  BloggerCourse.com  Fact Sheet for Healthcare Providers:  SeriousBroker.it  This test is no t yet approved or cleared by the [place] FDA and  has been authorized for detection and/or diagnosis of SARS-CoV-2 by FDA under an Emergency Use Authorization (EUA). This EUA will remain  in effect (meaning this test can be used) for the duration of the COVID-19 declaration under Section 564(b)(1) of the Act, 21 U.S.C.section 360bbb-3(b)(1), unless the authorization is terminated  or  revoked sooner.       Influenza A by PCR NEGATIVE NEGATIVE Final   Influenza B by PCR NEGATIVE NEGATIVE Final    Comment: (NOTE) The Xpert Xpress SARS-CoV-2/FLU/RSV plus assay is intended as an aid in the diagnosis of influenza from Nasopharyngeal swab specimens and should not be used as a sole basis for treatment. Nasal washings and aspirates are unacceptable for Xpert Xpress SARS-CoV-2/FLU/RSV testing.  Fact Sheet for Patients: BloggerCourse.com  Fact Sheet for Healthcare Providers: SeriousBroker.it  This test is not yet approved or cleared by the [place] FDA and has been authorized for detection and/or diagnosis of SARS-CoV-2 by FDA under an Emergency Use Authorization (EUA). This EUA will remain in effect (meaning this test can be used) for the duration of the COVID-19 declaration under Section 564(b)(1) of the Act, 21 U.S.C. section 360bbb-3(b)(1), unless the authorization is terminated or revoked.  Performed at  [facility], [address] [place], [place] [address]      Labs: BNP (last 3 results) No results for input(s): BNP in the last 8760 hours. Basic Metabolic Panel: Recent Labs  Lab 05/13/21 0838 05/13/21 0845  NA 137 139  K 4.2 4.2  CL 102 102  CO2 29  --   GLUCOSE 103* 98  BUN 15 15  CREATININE 1.19 1.30*  CALCIUM 9.5  --    Liver Function Tests: Recent Labs  Lab 05/13/21 0838  AST 16  ALT 15  ALKPHOS 91  BILITOT 1.0  PROT 7.3  ALBUMIN 4.0   No results for input(s): LIPASE, AMYLASE in the last 168 hours. No results for input(s): AMMONIA in the last 168 hours. CBC: Recent Labs  Lab 05/13/21 0838 05/13/21 0845  WBC 3.7*  --   NEUTROABS 1.9  --   HGB 14.2 14.6  HCT 42.8 43.0  MCV 103.4*  --   PLT 191  --    Cardiac Enzymes: No results for input(s): CKTOTAL, CKMB, CKMBINDEX, TROPONINI in the last 168 hours. BNP: Invalid input(s): POCBNP CBG: Recent Labs  Lab 05/13/21 0821  GLUCAP 69*   D-Dimer No results for input(s): DDIMER in the last 72 hours. Hgb A1c No results for input(s): HGBA1C in the last 72 hours. Lipid Profile Recent Labs    05/14/21 0446  CHOL 188  HDL 53  LDLCALC 124*  TRIG 56  CHOLHDL 3.5   Thyroid function studies No results for input(s): TSH, T4TOTAL, T3FREE, THYROIDAB in the last 72 hours.  Invalid input(s): FREET3 Anemia work up No results for input(s): VITAMINB12, FOLATE, FERRITIN, TIBC, IRON, RETICCTPCT in the last 72 hours. Urinalysis    Component Value Date/Time   COLORURINE YELLOW 03/30/2021 2208   APPEARANCEUR CLEAR 03/30/2021 2208   LABSPEC 1.014 03/30/2021 2208   PHURINE 6.0 03/30/2021 2208   GLUCOSEU NEGATIVE 03/30/2021 2208   HGBUR NEGATIVE 03/30/2021 2208   BILIRUBINUR NEGATIVE 03/30/2021 2208   KETONESUR NEGATIVE 03/30/2021 2208   PROTEINUR NEGATIVE 03/30/2021 2208   NITRITE NEGATIVE 03/30/2021 2208   LEUKOCYTESUR NEGATIVE 03/30/2021 2208   Sepsis Labs Invalid input(s): PROCALCITONIN,  WBC,   LACTICIDVEN Microbiology Recent Results (from the past 240 hour(s))  Resp Panel by RT-PCR (Flu A&B, Covid) Nasopharyngeal Swab     Status: None   Collection Time: 05/13/21  2:08 PM   Specimen: Nasopharyngeal Swab; Nasopharyngeal(NP) swabs in vial transport medium  Result Value Ref Range Status   SARS Coronavirus 2 by RT PCR NEGATIVE NEGATIVE Final    Comment: (NOTE) SARS-CoV-2 target nucleic acids are  NOT DETECTED.  The SARS-CoV-2 RNA is generally detectable in upper respiratory specimens during the acute phase of infection. The lowest concentration of SARS-CoV-2 viral copies this assay can detect is 138 copies/mL. A negative result does not preclude SARS-Cov-2 infection and should not be used as the sole basis for treatment or other patient management decisions. A negative result may occur with  improper specimen collection/handling, submission of specimen other than nasopharyngeal swab, presence of viral mutation(s) within the areas targeted by this assay, and inadequate number of viral copies(<138 copies/mL). A negative result must be combined with clinical observations, patient history, and epidemiological information. The expected result is Negative.  Fact Sheet for Patients:  BloggerCourse.com  Fact Sheet for Healthcare Providers:  SeriousBroker.it  This test is no t yet approved or cleared by the [place] FDA and  has been authorized for detection and/or diagnosis of SARS-CoV-2 by FDA under an Emergency Use Authorization (EUA). This EUA will remain  in effect (meaning this test can be used) for the duration of the COVID-19 declaration under Section 564(b)(1) of the Act, 21 U.S.C.section 360bbb-3(b)(1), unless the authorization is terminated  or revoked sooner.       Influenza A by PCR NEGATIVE NEGATIVE Final   Influenza B by PCR NEGATIVE NEGATIVE Final    Comment: (NOTE) The Xpert Xpress SARS-CoV-2/FLU/RSV plus  assay is intended as an aid in the diagnosis of influenza from Nasopharyngeal swab specimens and should not be used as a sole basis for treatment. Nasal washings and aspirates are unacceptable for Xpert Xpress SARS-CoV-2/FLU/RSV testing.  Fact Sheet for Patients: BloggerCourse.com  Fact Sheet for Healthcare Providers: SeriousBroker.it  This test is not yet approved or cleared by the [place] FDA and has been authorized for detection and/or diagnosis of SARS-CoV-2 by FDA under an Emergency Use Authorization (EUA). This EUA will remain in effect (meaning this test can be used) for the duration of the COVID-19 declaration under Section 564(b)(1) of the Act, 21 U.S.C. section 360bbb-3(b)(1), unless the authorization is terminated or revoked.  Performed at [facility], [address] [place], [place] [address]    Time coordinating discharge: 36 mins   SIGNED:  Standley Dakins, MD  [facility] 05/14/2021, 10:57 AM How to contact the [facility] Attending or Consulting provider 7A - 7P or covering provider during after hours 7P -7A, for this patient?  Check the care team in [facility] and look for a) attending/consulting [facility] provider listed and b) the [facility] team listed Log into [email or web address] and use [place]'s universal password to access. If you do not have the password, please contact the hospital operator. Locate the [facility] provider you are looking for under [facility] and page to a number that you can be directly reached. If you still have difficulty reaching the provider, please page the [facility] (Director on Call) for the Hospitalists listed on amion for assistance.

## 2021-05-14 NOTE — Progress Notes (Signed)
0305  administered as per order diphenhydrAMINE (BENADRYL) injection 12.5 mg    metoCLOPramide (REGLAN) injection 5 mg    Will continue to monitor

## 2021-05-14 NOTE — Plan of Care (Signed)
  Problem: Education: Goal: Knowledge of General Education information will improve Description: Including pain rating scale, medication(s)/side effects and non-pharmacologic comfort measures Outcome: Progressing   Problem: Health Behavior/Discharge Planning: Goal: Ability to manage health-related needs will improve Outcome: Progressing   Problem: Clinical Measurements: Goal: Ability to maintain clinical measurements within normal limits will improve Outcome: Progressing Goal: Will remain free from infection Outcome: Progressing Goal: Diagnostic test results will improve Outcome: Progressing Goal: Respiratory complications will improve Outcome: Progressing Goal: Cardiovascular complication will be avoided Outcome: Progressing   Problem: Coping: Goal: Level of anxiety will decrease Outcome: Progressing   Problem: Elimination: Goal: Will not experience complications related to bowel motility Outcome: Progressing Goal: Will not experience complications related to urinary retention Outcome: Progressing   Problem: Pain Managment: Goal: General experience of comfort will improve Outcome: Progressing   Problem: Safety: Goal: Ability to remain free from injury will improve Outcome: Progressing   Problem: Skin Integrity: Goal: Risk for impaired skin integrity will decrease Outcome: Progressing   Problem: Education: Goal: Knowledge of secondary prevention will improve (SELECT ALL) Outcome: Progressing

## 2021-05-14 NOTE — Progress Notes (Signed)
pt stating that his bottom lip is now numb which is different from base of being admitted.stated his headache is now rating a 8 of 10 on left side AND that vision has progressively worsened... Tylenol 650 mg was just given. Will wait on orders Will continue to monitor

## 2021-05-14 NOTE — Discharge Instructions (Signed)

## 2021-05-14 NOTE — Evaluation (Signed)
Physical Therapy Evaluation Patient Details Name: Marcus Dunn MRN: [id number] DOB: 05/10/1966 Today's Date: 05/14/2021  History of Present Illness  DUTCH ING is a 55 y.o. male with medical history significant for hypertension, coronary artery disease status post MI, history of TIA, recent hospitalization for similar symptoms of left-sided paresthesias and numbness in suspected migraine headaches.  He presented to the emergency department after experiencing symptoms of weakness and numbness and tingling on the left side of the body involving the upper and lower extremity.  He also has some experience of left-sided facial numbness.  He reports that he woke up feeling normal and at baseline at 4 AM and around 6:30 AM after eating breakfast he started to feel the symptoms.  He also reported that he felt like he was drooling when he was eating his breakfast.  He reported that during his last hospitalization for similar symptoms he had a TIA work-up and ended up being prescribed B12 supplements.  He said he has been feeling his normal self and doing well prior to this time.  He reports that he had no difficulty with speech or dizziness.  No loss of bowel or bladder control.  He reported left high vision was blurry compared to right during the episode.  He reports that he had not been using any type of recreational drugs.   Clinical Impression  Patient functioning at baseline for functional mobility and gait.  Plan:  Patient discharged from physical therapy to care of nursing for ambulation daily as tolerated for length of stay.         Recommendations for follow up therapy are one component of a multi-disciplinary discharge planning process, led by the attending physician.  Recommendations may be updated based on patient status, additional functional criteria and insurance authorization.  Follow Up Recommendations No PT follow up    Assistance Recommended at Discharge    Functional Status  Assessment Patient has not had a recent decline in their functional status  Equipment Recommendations  None recommended by PT    Recommendations for Other Services       Precautions / Restrictions Precautions Precautions: None Restrictions Weight Bearing Restrictions: No      Mobility  Bed Mobility Overal bed mobility: Independent                  Transfers Overall transfer level: Modified independent                      Ambulation/Gait Ambulation/Gait assistance: Modified independent (Device/Increase time) Gait Distance (Feet): 100 Feet Assistive device: None Gait Pattern/deviations: WFL(Within Functional Limits) Gait velocity: slightly decreased     General Gait Details: grossly WFL with good return for ambulation in room and hallways without loss of balance  Stairs            Wheelchair Mobility    Modified Rankin (Stroke Patients Only)       Balance Overall balance assessment: No apparent balance deficits (not formally assessed)                                           Pertinent Vitals/Pain Pain Assessment: 0-10 Pain Score: 2  Pain Location: left sided frontal headache Pain Descriptors / Indicators: Headache Pain Intervention(s): Limited activity within patient's tolerance;Monitored during session;Premedicated before session    Home Living Family/patient expects to be discharged to::  [facility]                        Prior Function               Mobility Comments: Independent ambulator without AD ADLs Comments: Independent     Hand Dominance   Dominant Hand: Right    Extremity/Trunk Assessment   Upper Extremity Assessment Upper Extremity Assessment: Defer to OT evaluation    Lower Extremity Assessment Lower Extremity Assessment: LLE deficits/detail LLE Deficits / Details: grossly 4+/5 LLE Sensation: WNL LLE Coordination: WNL    Cervical / Trunk Assessment Cervical /  Trunk Assessment: Normal  Communication   Communication: No difficulties  Cognition Arousal/Alertness: Awake/alert Behavior During Therapy: WFL for tasks assessed/performed Overall Cognitive Status: Within Functional Limits for tasks assessed                                          General Comments      Exercises     Assessment/Plan    PT Assessment Patient does not need any further PT services  PT Problem List         PT Treatment Interventions      PT Goals (Current goals can be found in the Care Plan section)  Acute Rehab PT Goals Patient Stated Goal: return home PT Goal Formulation: With patient Time For Goal Achievement: 05/14/21 Potential to Achieve Goals: Good    Frequency     Barriers to discharge        Co-evaluation               AM-PAC PT "6 Clicks" Mobility  Outcome Measure Help needed turning from your back to your side while in a flat bed without using bedrails?: None Help needed moving from lying on your back to sitting on the side of a flat bed without using bedrails?: None Help needed moving to and from a bed to a chair (including a wheelchair)?: None Help needed standing up from a chair using your arms (e.g., wheelchair or bedside chair)?: None Help needed to walk in hospital room?: None Help needed climbing 3-5 steps with a railing? : None 6 Click Score: 24    End of Session   Activity Tolerance: Patient tolerated treatment well Patient left: in bed;with family/visitor present;with call bell/phone within reach Nurse Communication: Mobility status PT Visit Diagnosis: Unsteadiness on feet (R26.81);Other abnormalities of gait and mobility (R26.89);Muscle weakness (generalized) (M62.81)    Time: 6333-5456 PT Time Calculation (min) (ACUTE ONLY): 15 min   Charges:   PT Evaluation $PT Eval Low Complexity: 1 Low PT Treatments $Therapeutic Activity: 8-22 mins        12:36 PM, 05/14/21 Ocie Bob, MPT Physical  Therapist with [facility] [phone] office [phone] mobile phone

## 2021-05-14 NOTE — Progress Notes (Signed)
OT Cancellation Note  Patient Details Name: Marcus Dunn MRN: [id number] DOB: August 31, 1965   Cancelled Treatment:    Reason Eval/Treat Not Completed: OT screened, no needs identified, will sign off. Pt screened for OT needs. Observed pt self-feeding as well as observed during part of PT session. Pt using BUE without difficulty, managing bed sheets and gown, moving/lifting/holding items on tray. Pt is at baseline with ADLs and functional mobility. No further OT needs at this time.    Ezra Sites, OTR/L  [phone] 05/14/2021, 12:53 PM

## 2021-07-03 ENCOUNTER — Emergency Department (HOSPITAL_COMMUNITY)

## 2021-07-03 ENCOUNTER — Observation Stay (HOSPITAL_COMMUNITY)
Admission: EM | Admit: 2021-07-03 | Discharge: 2021-07-04 | Disposition: A | Discharge status: Home / Self Care | Attending: Internal Medicine | Admitting: Internal Medicine

## 2021-07-03 ENCOUNTER — Other Ambulatory Visit: Payer: Self-pay

## 2021-07-03 ENCOUNTER — Encounter (HOSPITAL_COMMUNITY): Payer: Self-pay | Admitting: Emergency Medicine

## 2021-07-03 DIAGNOSIS — Z79899 Other long term (current) drug therapy: Secondary | ICD-10-CM | POA: Insufficient documentation

## 2021-07-03 DIAGNOSIS — I10 Essential (primary) hypertension: Secondary | ICD-10-CM | POA: Diagnosis not present

## 2021-07-03 DIAGNOSIS — K219 Gastro-esophageal reflux disease without esophagitis: Secondary | ICD-10-CM | POA: Diagnosis not present

## 2021-07-03 DIAGNOSIS — I63411 Cerebral infarction due to embolism of right middle cerebral artery: Secondary | ICD-10-CM | POA: Diagnosis not present

## 2021-07-03 DIAGNOSIS — R200 Anesthesia of skin: Principal | ICD-10-CM | POA: Insufficient documentation

## 2021-07-03 DIAGNOSIS — Z7982 Long term (current) use of aspirin: Secondary | ICD-10-CM | POA: Diagnosis not present

## 2021-07-03 DIAGNOSIS — I252 Old myocardial infarction: Secondary | ICD-10-CM

## 2021-07-03 DIAGNOSIS — Z20822 Contact with and (suspected) exposure to covid-19: Secondary | ICD-10-CM | POA: Insufficient documentation

## 2021-07-03 DIAGNOSIS — E785 Hyperlipidemia, unspecified: Secondary | ICD-10-CM | POA: Diagnosis not present

## 2021-07-03 DIAGNOSIS — R531 Weakness: Secondary | ICD-10-CM

## 2021-07-03 DIAGNOSIS — R2990 Unspecified symptoms and signs involving the nervous system: Secondary | ICD-10-CM

## 2021-07-03 LAB — PROTIME-INR
INR: 1 (ref 0.8–1.2)
Prothrombin Time: 13 seconds (ref 11.4–15.2)

## 2021-07-03 LAB — DIFFERENTIAL
Abs Immature Granulocytes: 0.01 10*3/uL (ref 0.00–0.07)
Basophils Absolute: 0 10*3/uL (ref 0.0–0.1)
Basophils Relative: 1 %
Eosinophils Absolute: 0.2 10*3/uL (ref 0.0–0.5)
Eosinophils Relative: 4 %
Immature Granulocytes: 0 %
Lymphocytes Relative: 35 %
Lymphs Abs: 1.6 10*3/uL (ref 0.7–4.0)
Monocytes Absolute: 0.4 10*3/uL (ref 0.1–1.0)
Monocytes Relative: 10 %
Neutro Abs: 2.3 10*3/uL (ref 1.7–7.7)
Neutrophils Relative %: 50 %

## 2021-07-03 LAB — URINALYSIS, ROUTINE W REFLEX MICROSCOPIC
Bilirubin Urine: NEGATIVE
Glucose, UA: NEGATIVE mg/dL
Hgb urine dipstick: NEGATIVE
Ketones, ur: NEGATIVE mg/dL
Leukocytes,Ua: NEGATIVE
Nitrite: NEGATIVE
Protein, ur: NEGATIVE mg/dL
Specific Gravity, Urine: 1.009 (ref 1.005–1.030)
pH: 6 (ref 5.0–8.0)

## 2021-07-03 LAB — COMPREHENSIVE METABOLIC PANEL
ALT: 24 U/L (ref 0–44)
AST: 28 U/L (ref 15–41)
Albumin: 3.9 g/dL (ref 3.5–5.0)
Alkaline Phosphatase: 65 U/L (ref 38–126)
Anion gap: 8 (ref 5–15)
BUN: 15 mg/dL (ref 6–20)
CO2: 24 mmol/L (ref 22–32)
Calcium: 8.4 mg/dL — ABNORMAL LOW (ref 8.9–10.3)
Chloride: 108 mmol/L (ref 98–111)
Creatinine, Ser: 1.14 mg/dL (ref 0.61–1.24)
GFR, Estimated: >60 mL/min (ref >60)
Glucose, Bld: 77 mg/dL (ref 70–99)
Potassium: 4.1 mmol/L (ref 3.5–5.1)
Sodium: 140 mmol/L (ref 135–145)
Total Bilirubin: 1.2 mg/dL (ref 0.3–1.2)
Total Protein: 6.8 g/dL (ref 6.5–8.1)

## 2021-07-03 LAB — RAPID URINE DRUG SCREEN, HOSP PERFORMED
Amphetamines: NOT DETECTED
Barbiturates: NOT DETECTED
Benzodiazepines: NOT DETECTED
Cocaine: NOT DETECTED
Opiates: NOT DETECTED
Tetrahydrocannabinol: NOT DETECTED

## 2021-07-03 LAB — RESP PANEL BY RT-PCR (FLU A&B, COVID) ARPGX2
Influenza A by PCR: NEGATIVE
Influenza B by PCR: NEGATIVE
SARS Coronavirus 2 by RT PCR: NEGATIVE

## 2021-07-03 LAB — CBC
HCT: 41.4 % (ref 39.0–52.0)
Hemoglobin: 13.7 g/dL (ref 13.0–17.0)
MCH: 34.3 pg — ABNORMAL HIGH (ref 26.0–34.0)
MCHC: 33.1 g/dL (ref 30.0–36.0)
MCV: 103.5 fL — ABNORMAL HIGH (ref 80.0–100.0)
Platelets: 185 10*3/uL (ref 150–400)
RBC: 4 MIL/uL — ABNORMAL LOW (ref 4.22–5.81)
RDW: 11.8 % (ref 11.5–15.5)
WBC: 4.6 10*3/uL (ref 4.0–10.5)
nRBC: 0 % (ref 0.0–0.2)

## 2021-07-03 LAB — CBG MONITORING, ED: Glucose-Capillary: 85 mg/dL (ref 70–99)

## 2021-07-03 LAB — APTT: aPTT: 30 seconds (ref 24–36)

## 2021-07-03 LAB — ETHANOL: Alcohol, Ethyl (B): <10 mg/dL (ref <10)

## 2021-07-03 IMAGING — CT CT HEAD CODE STROKE
3 series · 15 of 47 positions shown, 18 images · non-contrast
Comparison: CT head [DATE], MR head [DATE]

CLINICAL DATA: Code stroke.  Left-sided weakness and numbness

EXAM:
CT HEAD WITHOUT CONTRAST
TECHNIQUE: Contiguous axial images were obtained from the base of the skull
through the vertex without intravenous contrast.

[Series 2: head w o · axial · 0.47mm/px · z∈[-9,+131]mm · 9 of 34 slices shown, 12 images]
[im 3/34  brain]
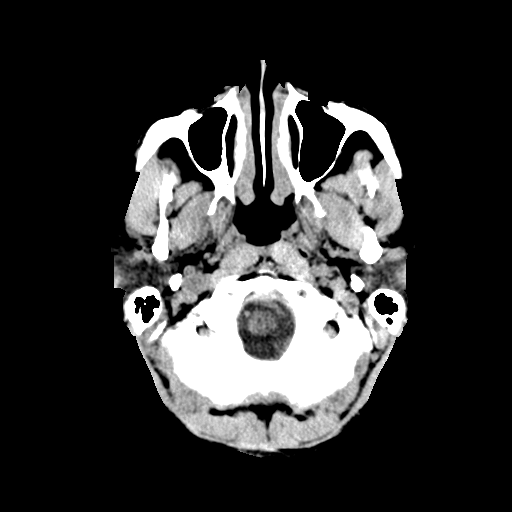
[im 3/34  bone]
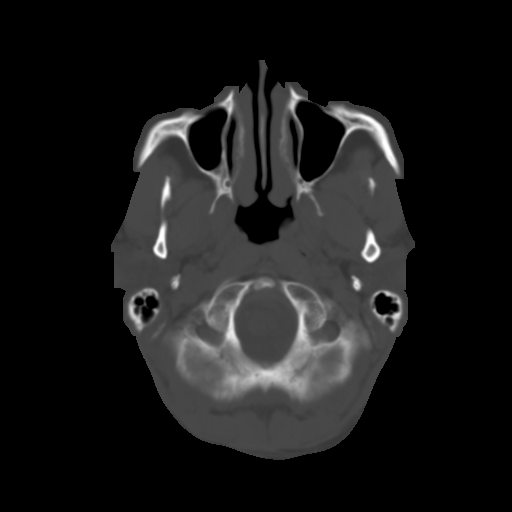
[im 6/34  brain]
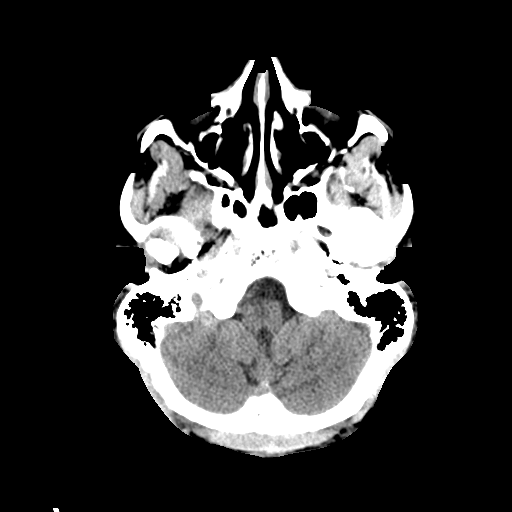
[im 10/34  brain]
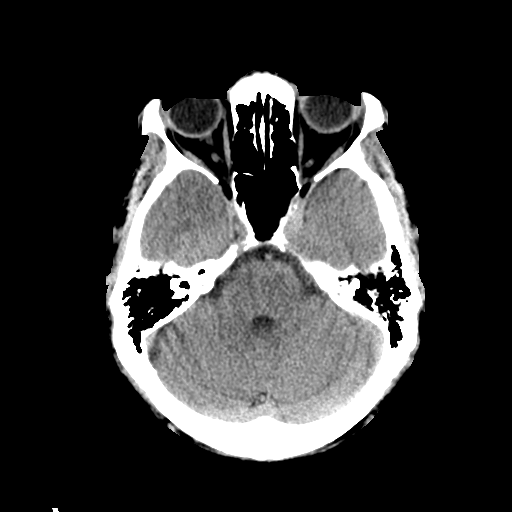
[im 13/34  brain]
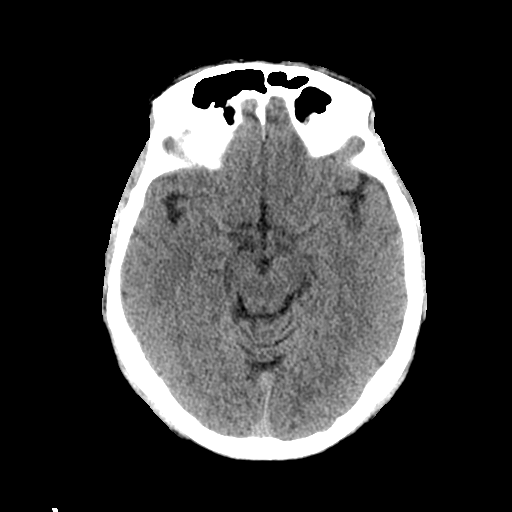
[im 18/34  brain]
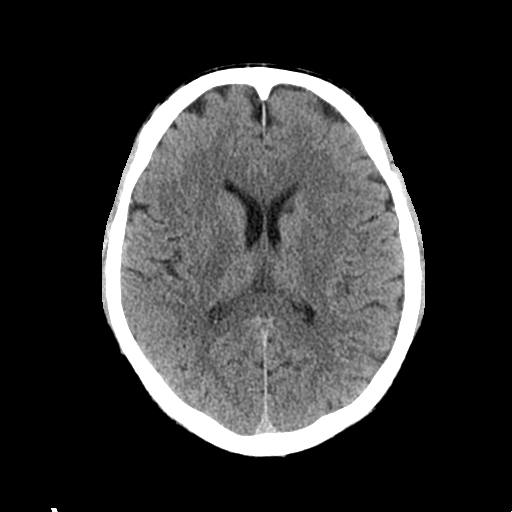
[im 18/34  bone]
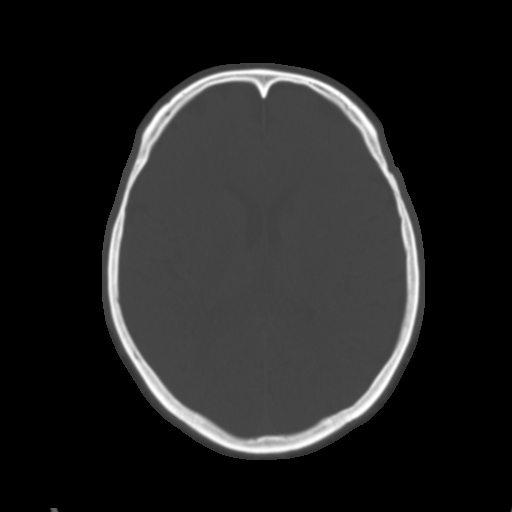
[im 21/34  brain]
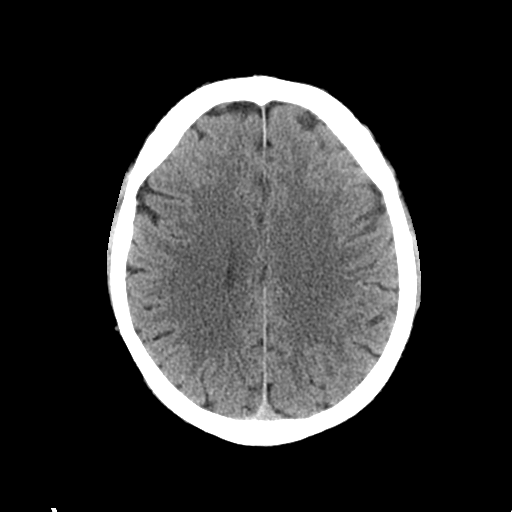
[im 24/34  brain]
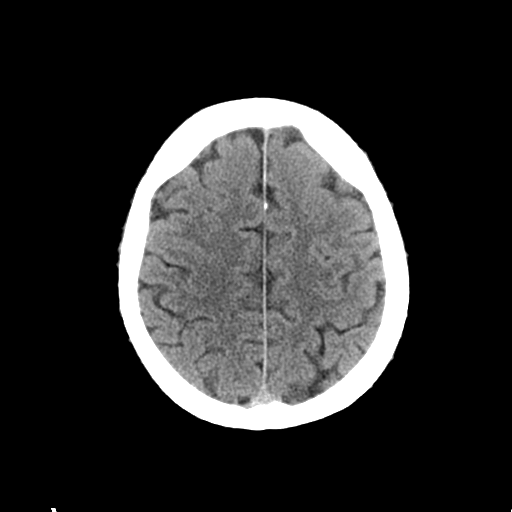
[im 28/34  brain]
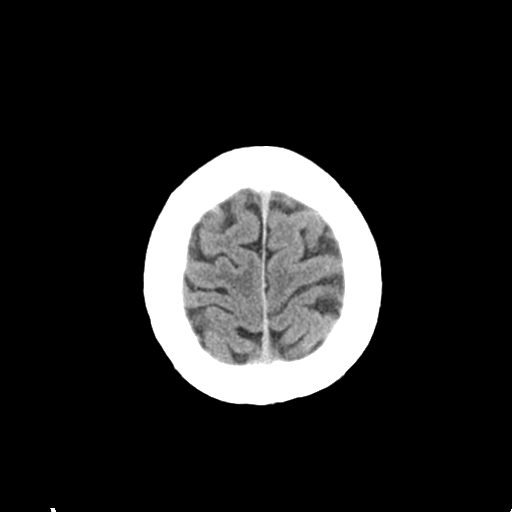
[im 31/34  brain]
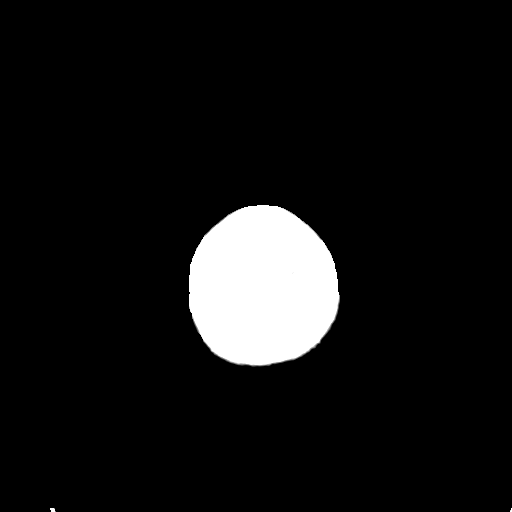
[im 31/34  bone]
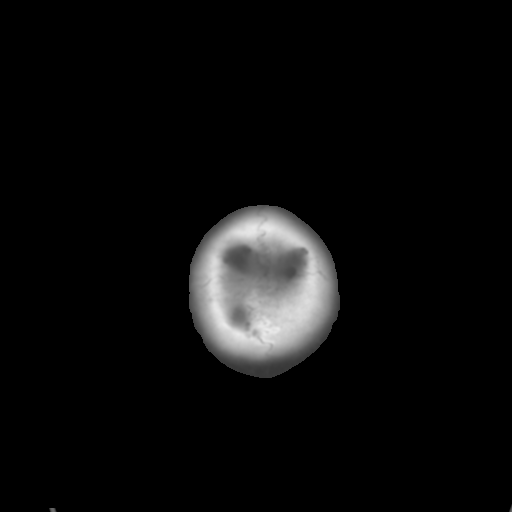

[Series 4: coronal soft · coronal · 0.34mm/px · 3 of 67 slices shown]
[im 23/67  brain]
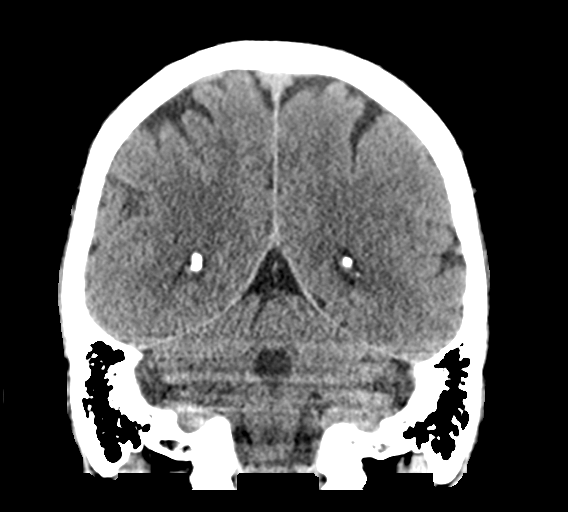
[im 30/67  brain]
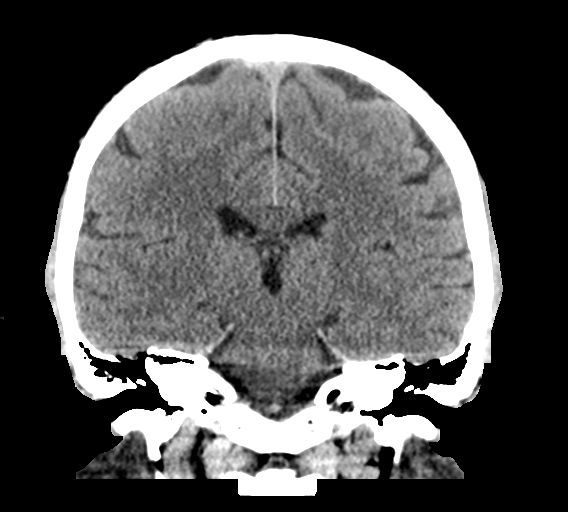
[im 37/67  brain]
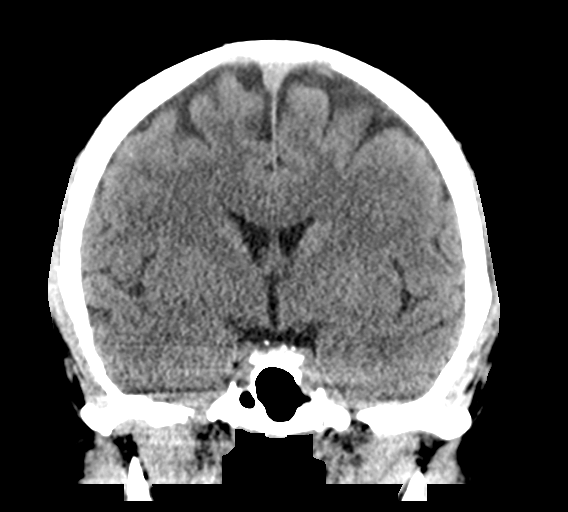

[Series 5: sagittal soft · sagittal · 0.33mm/px · 3 of 59 slices shown]
[im 20/59  brain]
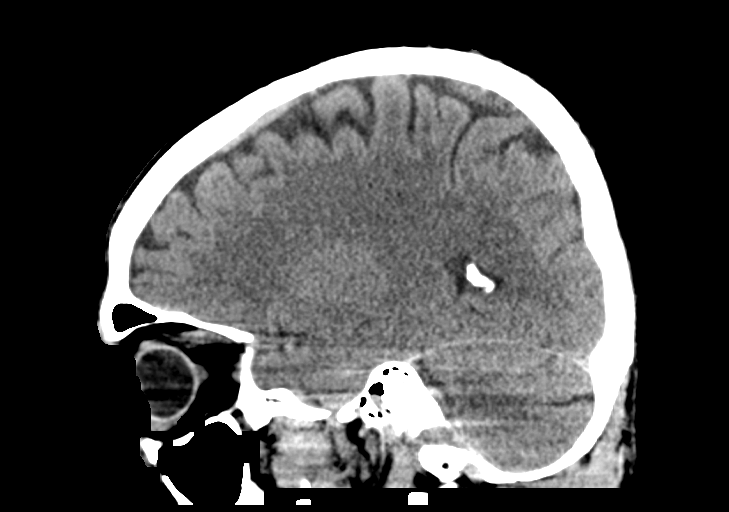
[im 30/59  brain]
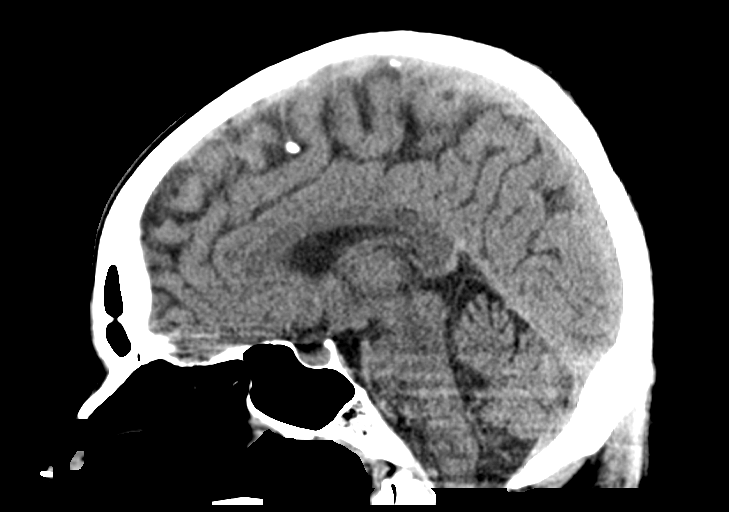
[im 39/59  brain]
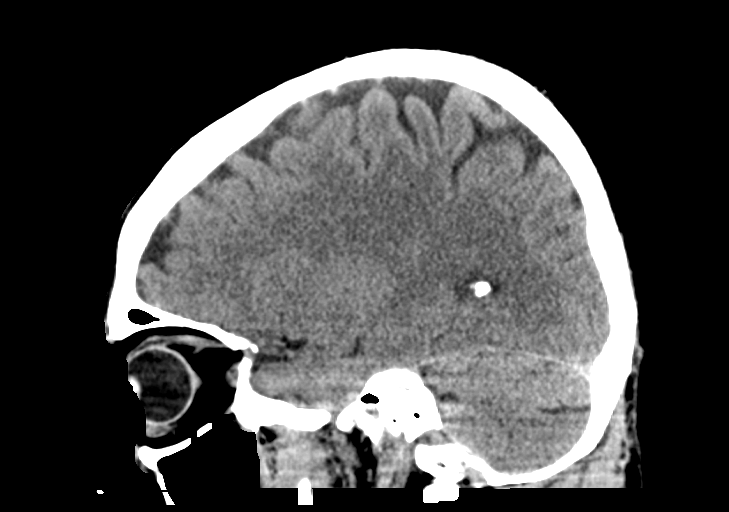

[15 of 47 positions shown; findings below may reference images not displayed]

FINDINGS: Brain: There is no evidence of acute intracranial hemorrhage,
extra-axial fluid collection, or acute infarct.

Parenchymal volume is normal. The ventricles are normal in size. A
small remote lacunar infarct in the left caudate head is unchanged.
There is no mass lesion. There is no midline shift.

Vascular: No hyperdense vessel or unexpected calcification.

Skull: Normal. Negative for fracture or focal lesion.

Sinuses/Orbits: Imaged paranasal sinuses are clear. The globes and
orbits are unremarkable.

Other: None.

ASPECTS (Alberta Stroke Program Early CT Score)

- Ganglionic level infarction (caudate, lentiform nuclei, internal
capsule, insula, M1-M3 cortex): 7

- Supraganglionic infarction (M4-M6 cortex): 3

Total score (0-10 with 10 being normal): 10
IMPRESSION: 1. No acute intracranial pathology.
2. ASPECTS is 10

## 2021-07-03 IMAGING — MR MR HEAD W/O CM
11 of 12 series · 41 of 48 positions shown · non-contrast
Comparison: Same day CT head.  MRI [DATE].

CLINICAL DATA: Neuro deficit, acute, stroke suspected

EXAM:
MRI HEAD WITHOUT CONTRAST
TECHNIQUE: Multiplanar, multiecho pulse sequences of the brain and surrounding
structures were obtained without intravenous contrast.

[Series 5: DWI · axial · 4.0mm · 0.88mm/px · z∈[-76,+55]mm · 4 of 36 slices shown (1 of 6)]
[im 1/36]
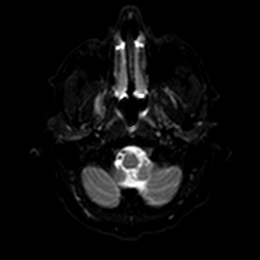
[im 12/36]
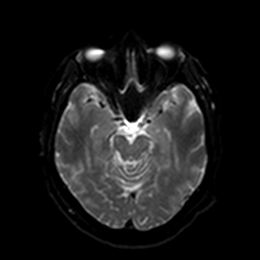
[im 24/36]
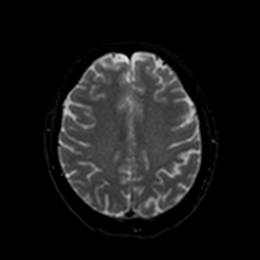
[im 36/36]
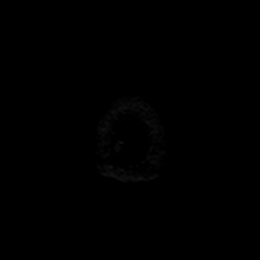

[Series 5: DWI · axial · 4.0mm · 0.88mm/px · z∈[-76,+55]mm · 4 of 36 slices shown (2 of 6)]
[im 1/36]
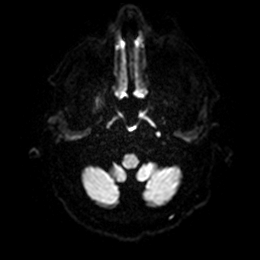
[im 12/36]
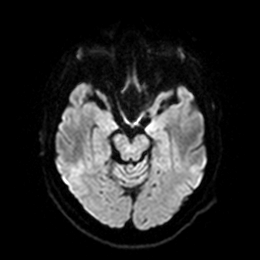
[im 24/36]
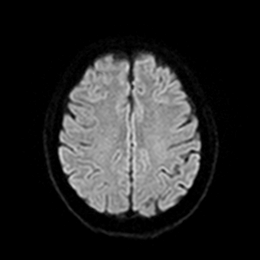
[im 36/36]
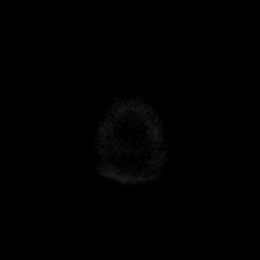

[Series 6: DWI · axial · 4.0mm · 0.88mm/px · z∈[-76,+55]mm · 4 of 36 slices shown (3 of 6)]
[im 1/36]
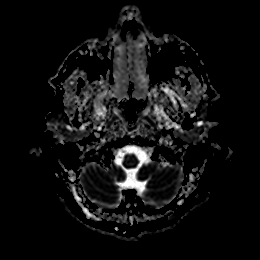
[im 12/36]
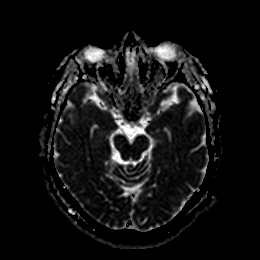
[im 24/36]
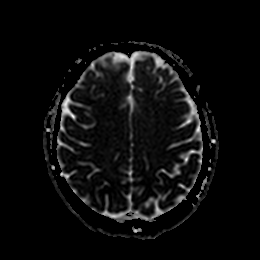
[im 36/36]
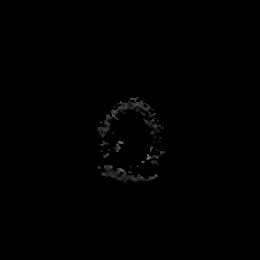

[Series 7: DWI · coronal · 5.0mm · 0.88mm/px · 3 of 29 slices shown (4 of 6)]
[im 1/29]
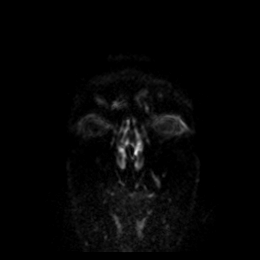
[im 15/29]
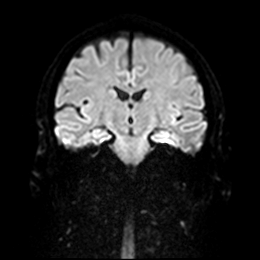
[im 29/29]
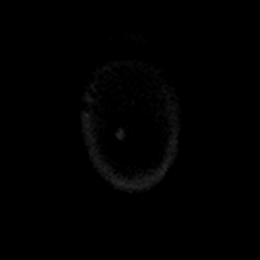

[Series 7: DWI · coronal · 5.0mm · 0.88mm/px · 4 of 29 slices shown (5 of 6)]
[im 1/29]
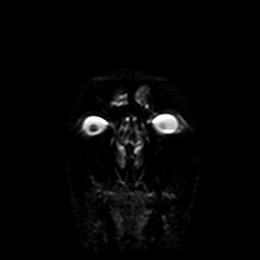
[im 10/29]
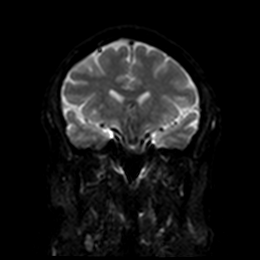
[im 19/29]
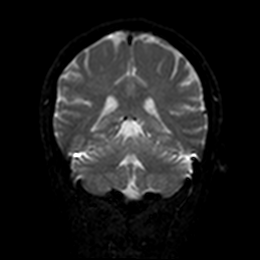
[im 29/29]
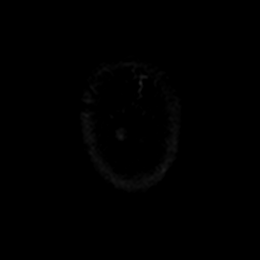

[Series 8: DWI · coronal · 5.0mm · 0.88mm/px · 4 of 29 slices shown (6 of 6)]
[im 1/29]
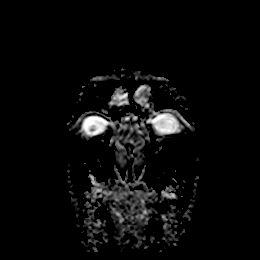
[im 10/29]
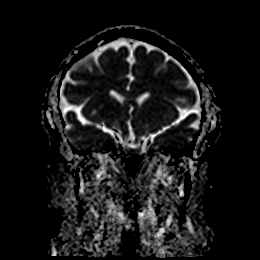
[im 19/29]
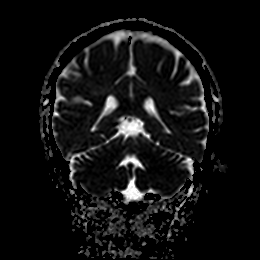
[im 29/29]
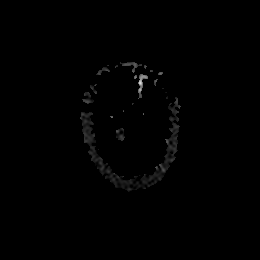

[Series 9: T1 · sagittal · 5.0mm · 0.94mm/px · 3 of 22 slices shown]
[im 1/22]
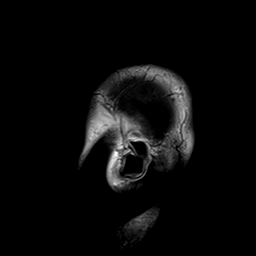
[im 11/22]
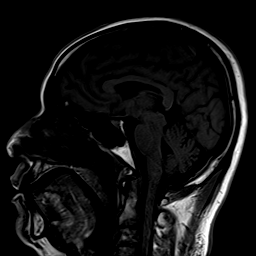
[im 22/22]
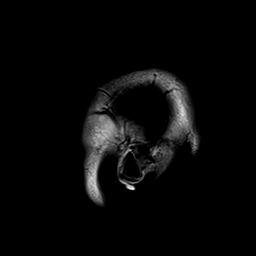

[Series 10: T2 · axial · 5.0mm · 0.72mm/px · z∈[-76,+55]mm · 3 of 21 slices shown (1 of 2)]
[im 1/21]
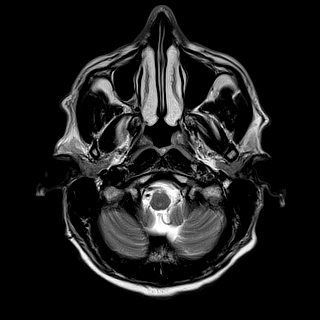
[im 11/21]
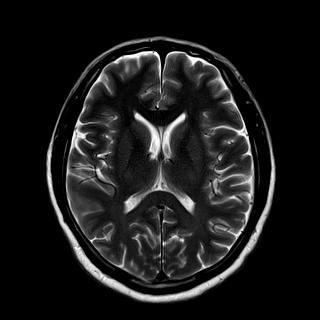
[im 21/21]
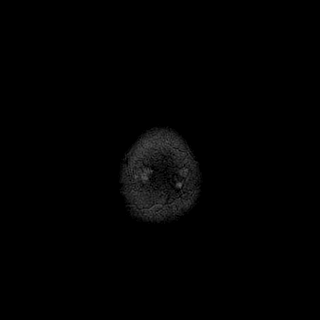

[Series 11: ax hemo · axial · 5.0mm · 0.86mm/px · z∈[-75,+60]mm · 3 of 25 slices shown]
[im 1/25]
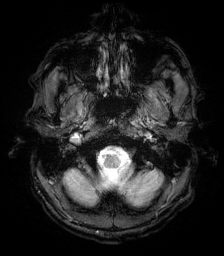
[im 13/25]
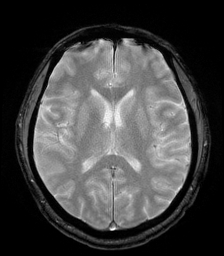
[im 25/25]
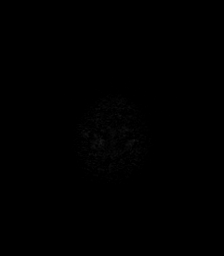

[Series 12: FLAIR · axial · 4.0mm · 0.43mm/px · z∈[-75,+60]mm · 5 of 37 slices shown]
[im 1/37]
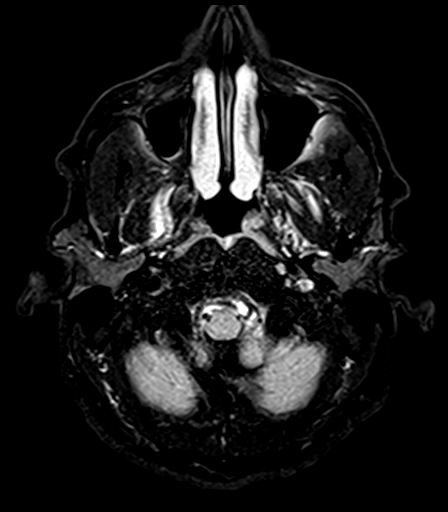
[im 10/37]
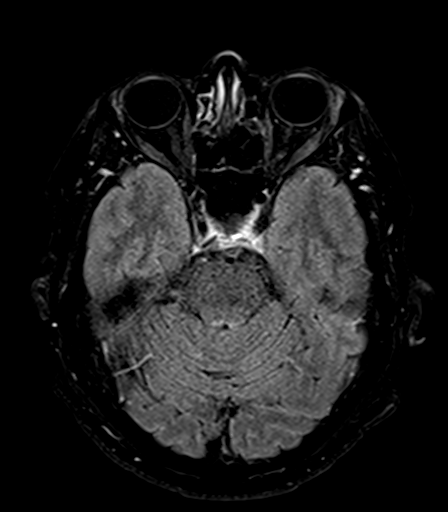
[im 19/37]
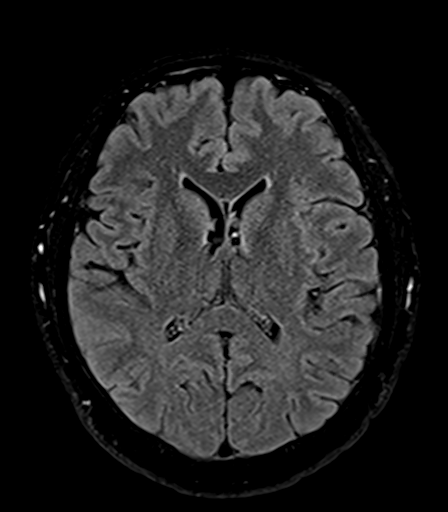
[im 28/37]
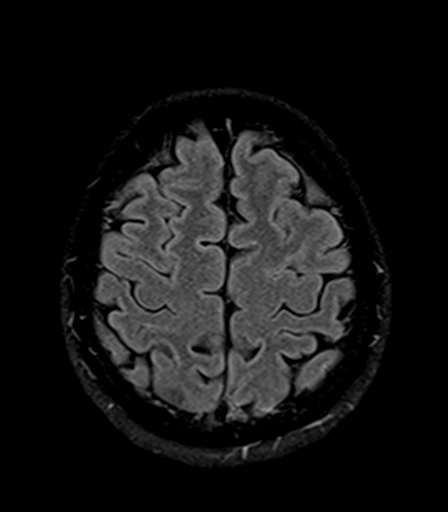
[im 37/37]
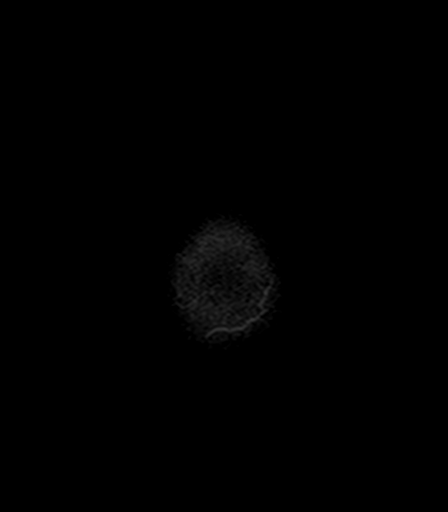

[Series 14: T2 · coronal · 5.0mm · 0.72mm/px · 4 of 28 slices shown (2 of 2)]
[im 1/28]
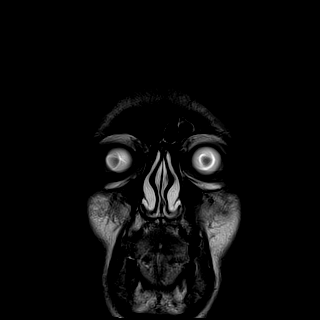
[im 10/28]
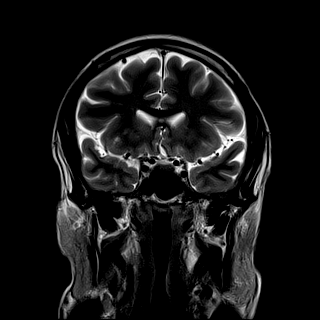
[im 19/28]
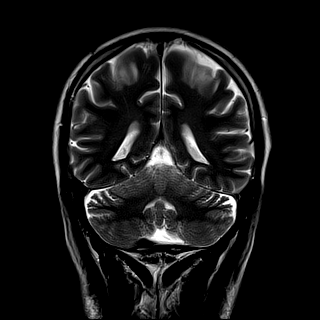
[im 28/28]
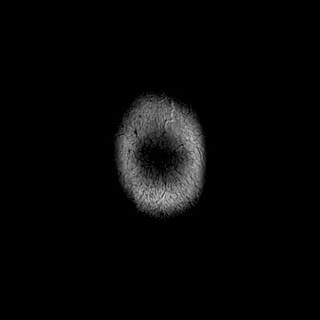

[41 of 48 positions shown; findings below may reference images not displayed]

FINDINGS: Brain: No acute infarction, hemorrhage, hydrocephalus, extra-axial
collection or mass lesion. Small remote infarct in the left caudate.

Vascular: Major arterial flow voids are maintained at the skull
base.

Skull and upper cervical spine: Normal marrow signal.

Sinuses/Orbits: Mild paranasal sinus mucosal thickening.
Unremarkable orbits.

Other: No mastoid effusions.
IMPRESSION: 1. No evidence of acute intracranial abnormality.
2. Small remote lacunar infarct in the left caudate.

## 2021-07-03 IMAGING — CT CT ANGIO HEAD-NECK (W OR W/O PERF)
2 of 7 series · 8 of 33 positions shown · IV contrast (Omnipaque or Isovue)
Comparison: Same-day noncontrast CT head, MR head [DATE], CTA
head and neck [DATE]

CLINICAL DATA: Left-sided weakness and numbness

EXAM:
CT ANGIOGRAPHY HEAD AND NECK
TECHNIQUE: Multidetector CT imaging of the head and neck was performed using
the standard protocol during bolus administration of intravenous
contrast. Multiplanar CT image reconstructions and MIPs were
obtained to evaluate the vascular anatomy. Carotid stenosis
measurements (when applicable) are obtained utilizing NASCET
criteria, using the distal internal carotid diameter as the
denominator.
CONTRAST:  75mL OMNIPAQUE IOHEXOL 350 MG/ML SOLN

[Series 4: cta head & neck · axial · 0.49mm/px · z∈[-33,+85]mm · 2 of 179 slices shown]
[im 60/179  soft-tissue]
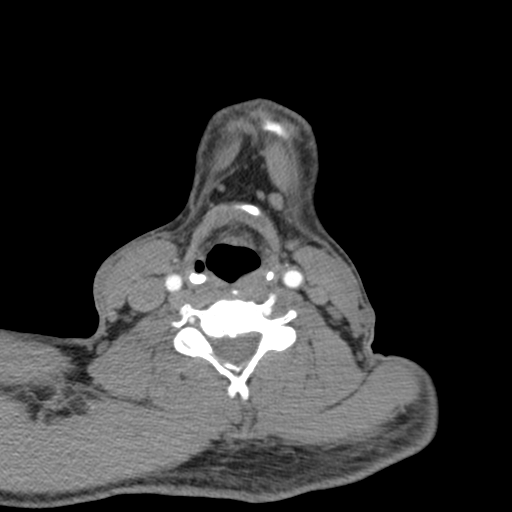
[im 119/179  soft-tissue]
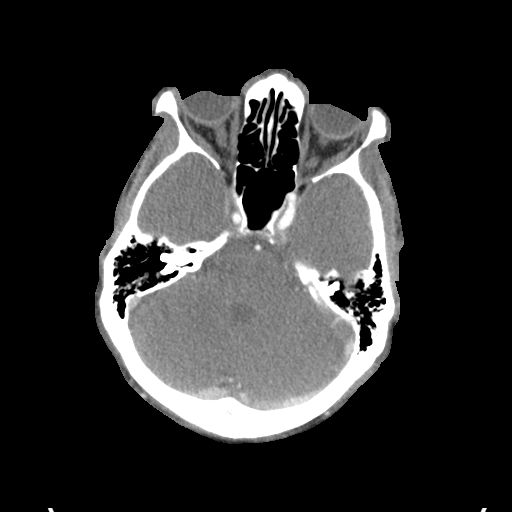

[Series 6: ax thins · axial · 0.39mm/px · z∈[-101,+153]mm · 6 of 356 slices shown]
[im 51/356  soft-tissue]
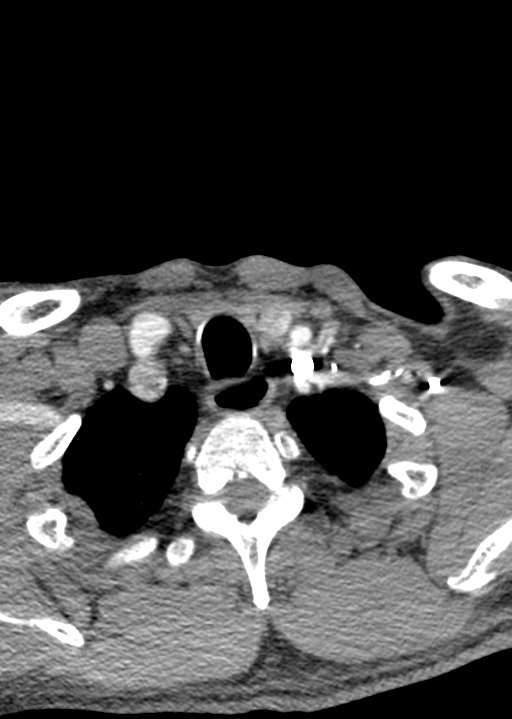
[im 102/356  bone]
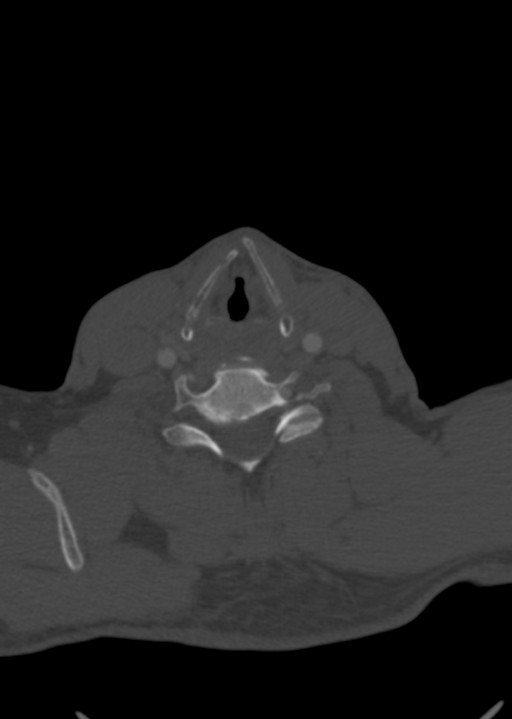
[im 153/356  soft-tissue]
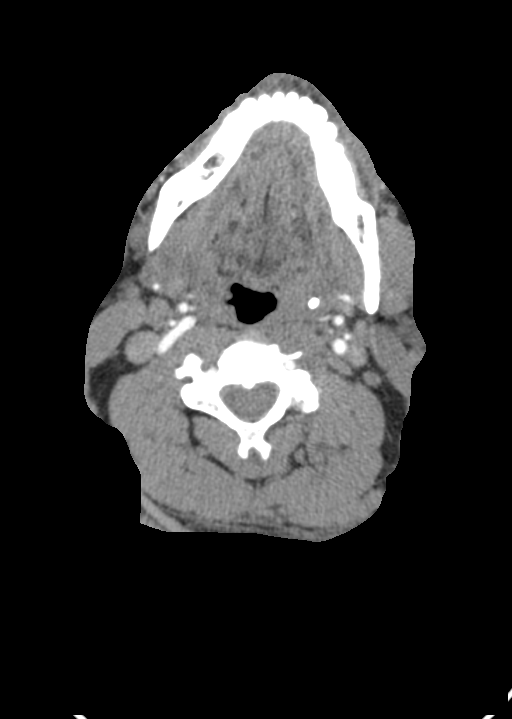
[im 203/356  bone]
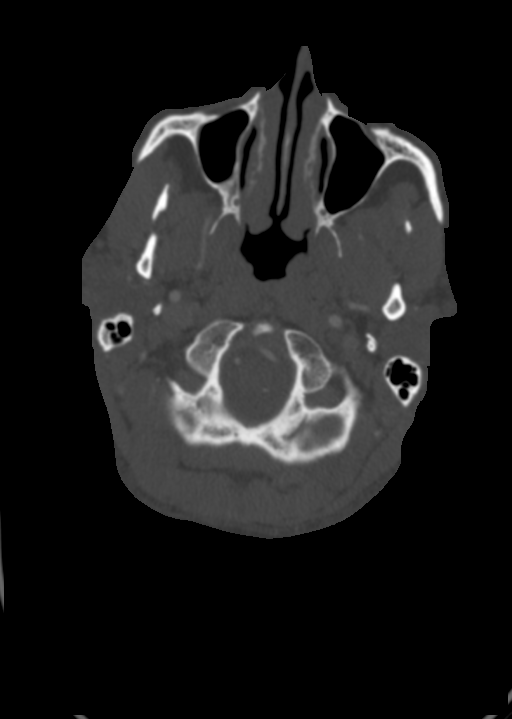
[im 254/356  soft-tissue]
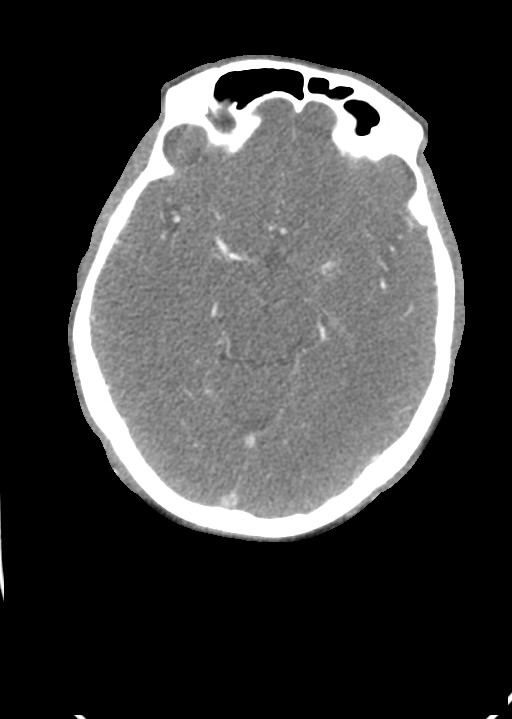
[im 305/356  bone]
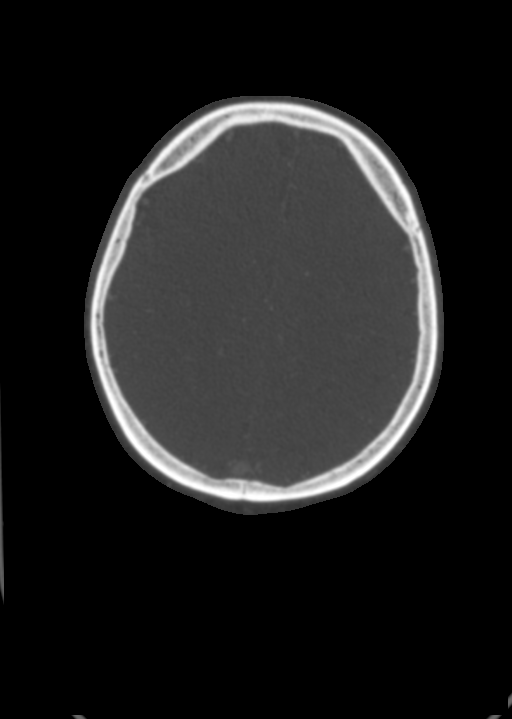

[8 of 33 positions shown; findings below may reference images not displayed]

FINDINGS: CTA NECK FINDINGS

Aortic arch: Standard branching. Imaged portion shows no evidence of
aneurysm or dissection. No significant stenosis of the major arch
vessel origins.

Right carotid system: The right common, internal, and external
carotid arteries are patent, without hemodynamically significant
stenosis or occlusion. There is no dissection or aneurysm.

Left carotid system: The left common, internal, and external carotid
arteries are patent, without hemodynamically significant stenosis or
occlusion. There is no dissection or aneurysm. There is minimal
calcified plaque in the left carotid bulb.

Vertebral arteries: The vertebral arteries are patent, without
hemodynamically significant stenosis or occlusion. There is no
dissection or aneurysm.

Skeleton: There is mild degenerative change of the cervical spine.
There is no visible canal hematoma. There is no acute osseous
abnormality or aggressive osseous lesion.

Other neck: The soft tissues are unremarkable.

Upper chest: The imaged lung apices are clear.

Review of the MIP images confirms the above findings

CTA HEAD FINDINGS

Anterior circulation: The intracranial ICAs are patent, without
hemodynamically significant stenosis or occlusion.

The bilateral MCAs are patent.

The bilateral ACAs are patent. The anterior communicating artery is
normal.

There is no aneurysm.

Posterior circulation: The bilateral V4 segments are patent. PICA is
identified bilaterally. The basilar artery is patent.

The bilateral PCAs are patent. The left posterior communicating
artery is identified. The right posterior communicating artery is
not seen.

There is no aneurysm.

Venous sinuses: As permitted by contrast timing, patent.

Anatomic variants: None.

Review of the MIP images confirms the above findings
IMPRESSION: Patent vasculature of the head and neck with no hemodynamically
significant stenosis or occlusion. No dissection or aneurysm.

## 2021-07-03 MED ORDER — SENNOSIDES-DOCUSATE SODIUM 8.6-50 MG PO TABS: 1.0000 | ORAL_TABLET | Freq: Every evening | ORAL | Status: DC | PRN

## 2021-07-03 MED ORDER — ACETAMINOPHEN 160 MG/5ML PO SOLN: 650.0000 mg | ORAL | Status: DC | PRN

## 2021-07-03 MED ORDER — PANTOPRAZOLE SODIUM 40 MG PO TBEC
40.0000 mg | DELAYED_RELEASE_TABLET | Freq: Every day | ORAL | Status: DC
  Administered 2021-07-04: 40 mg via ORAL
  Filled 2021-07-03: qty 1

## 2021-07-03 MED ORDER — ENOXAPARIN SODIUM 40 MG/0.4ML IJ SOSY
40.0000 mg | PREFILLED_SYRINGE | INTRAMUSCULAR | Status: DC
  Administered 2021-07-03 – 2021-07-04 (×2): 40 mg via SUBCUTANEOUS
  Filled 2021-07-03 (×3): qty 0.4

## 2021-07-03 MED ORDER — ATORVASTATIN CALCIUM 20 MG PO TABS
20.0000 mg | ORAL_TABLET | Freq: Every evening | ORAL | Status: DC
  Administered 2021-07-03: 20 mg via ORAL
  Filled 2021-07-03: qty 1

## 2021-07-03 MED ORDER — CLOPIDOGREL BISULFATE 75 MG PO TABS
75.0000 mg | ORAL_TABLET | Freq: Every day | ORAL | Status: DC
  Administered 2021-07-03 – 2021-07-04 (×2): 75 mg via ORAL
  Filled 2021-07-03 (×2): qty 1

## 2021-07-03 MED ORDER — PROCHLORPERAZINE EDISYLATE 10 MG/2ML IJ SOLN
10.0000 mg | Freq: Once | INTRAMUSCULAR | Status: AC
Start: 2021-07-03 — End: 2021-07-03
  Administered 2021-07-03: 10 mg via INTRAVENOUS
  Filled 2021-07-03: qty 2

## 2021-07-03 MED ORDER — ACETAMINOPHEN 325 MG PO TABS: 650.0000 mg | ORAL_TABLET | ORAL | Status: DC | PRN

## 2021-07-03 MED ORDER — ACETAMINOPHEN 650 MG RE SUPP: 650.0000 mg | RECTAL | Status: DC | PRN

## 2021-07-03 MED ORDER — KETOROLAC TROMETHAMINE 15 MG/ML IJ SOLN
15.0000 mg | Freq: Once | INTRAMUSCULAR | Status: AC
  Administered 2021-07-03: 15 mg via INTRAVENOUS
  Filled 2021-07-03: qty 1

## 2021-07-03 MED ORDER — ASPIRIN 300 MG RE SUPP: 300.0000 mg | Freq: Every day | RECTAL | Status: DC

## 2021-07-03 MED ORDER — IOHEXOL 350 MG/ML SOLN
100.0000 mL | Freq: Once | INTRAVENOUS | Status: AC | PRN
  Administered 2021-07-03: 75 mL via INTRAVENOUS

## 2021-07-03 MED ORDER — SODIUM CHLORIDE 0.9 % IV SOLN: INTRAVENOUS | Status: AC

## 2021-07-03 MED ORDER — ASPIRIN 325 MG PO TABS
325.0000 mg | ORAL_TABLET | Freq: Every day | ORAL | Status: DC
  Administered 2021-07-03: 325 mg via ORAL
  Filled 2021-07-03 (×2): qty 1

## 2021-07-03 MED ORDER — DIPHENHYDRAMINE HCL 50 MG/ML IJ SOLN
25.0000 mg | Freq: Once | INTRAMUSCULAR | Status: AC
  Administered 2021-07-03: 25 mg via INTRAVENOUS
  Filled 2021-07-03: qty 1

## 2021-07-03 MED ORDER — STROKE: EARLY STAGES OF RECOVERY BOOK: Freq: Once | Status: AC

## 2021-07-03 NOTE — ED Notes (Signed)
Pt to CT 1106 CBG 85

## 2021-07-03 NOTE — ED Notes (Signed)
Pt back from MRI 

## 2021-07-03 NOTE — ED Provider Notes (Signed)
[facility] MEDICAL SURGICAL UNIT Provider Note  CSN: [id number] Arrival date & time: 07/03/21 1052  Chief Complaint(s) Numbness  HPI Marcus Dunn is a 56 y.o. male who presents emergency department evaluation of weakness and numbness.  Last known well 10:20 AM.  He states that he had sudden onset left-sided weakness and numbness that presented similar to his previous presentations.  Denies chest pain, shortness of breath, abdominal-nausea, vomiting, fever or any other systemic symptoms.  Does endorse a headache.  HPI  Past Medical History Past Medical History:  Diagnosis Date   Hypertension    MI (myocardial infarction) [facility])    Patient Active Problem List   Diagnosis Date Noted   Stroke-like symptoms 07/03/2021   Left-sided weakness 05/13/2021   History of MI (myocardial infarction) 05/13/2021   Migraine headache 05/13/2021   Syncope 03/31/2021   Numbness and tingling of left arm and leg 03/31/2021   Chest pain 03/31/2021   Essential hypertension 03/31/2021   Home Medication(s) Prior to Admission medications   Medication Sig Start Date End Date Taking? Authorizing Provider  aspirin EC 81 MG EC tablet Take 1 tablet (81 mg total) by mouth daily. Swallow whole. 05/15/21  Yes Johnson, Clanford L, MD  atorvastatin (LIPITOR) 10 MG tablet Take 1 tablet (10 mg total) by mouth every evening. 05/14/21 05/14/22 Yes Johnson, Clanford L, MD  Cholecalciferol (VITAMIN D3) 1.25 MG (50000 UT) CAPS Take 1 capsule by mouth once a week.   Yes [provider]  ibuprofen (MOTRIN IB) 200 MG tablet Take 1 tablet (200 mg total) by mouth every 8 (eight) hours as needed for up to 20 doses for mild pain. 04/01/21  Yes Elgergawy, Leana Roe, MD  omeprazole (PRILOSEC OTC) 20 MG tablet Take 1 tablet (20 mg total) by mouth daily with breakfast. 05/14/21  Yes Johnson, Clanford L, MD  vitamin B-12 (CYANOCOBALAMIN) 500 MCG tablet Take 1 tablet (500 mcg total) by mouth daily. Patient not taking:  Reported on 07/03/2021 04/01/21   Elgergawy, Leana Roe, MD                                                                                                                                    Past Surgical History Past Surgical History:  Procedure Laterality Date   HERNIA REPAIR     KNEE SURGERY     SHOULDER SURGERY     Family History History reviewed. No pertinent family history.  Social History Social History   Tobacco Use   Smoking status: Never   Smokeless tobacco: Never  Vaping Use   Vaping Use: Never used  Substance Use Topics   Alcohol use: Not Currently   Drug use: Not Currently   Allergies Chocolate, Hydrochlorothiazide, Lisinopril, and Penicillins  Review of Systems Review of Systems  Neurological:  Positive for weakness, numbness and headaches.   Physical Exam Vital Signs  I have reviewed the triage vital signs BP 132/74  Pulse 71    Temp 97.7 F (36.5 C) (Oral)    Resp 20    Ht 6\' 4"  (1.93 m)    Wt 92.1 kg    SpO2 100%    BMI 24.72 kg/m   Physical Exam Vitals and nursing note reviewed.  Constitutional:      General: He is not in acute distress.    Appearance: He is well-developed.  HENT:     Head: Normocephalic and atraumatic.  Eyes:     Conjunctiva/sclera: Conjunctivae normal.  Cardiovascular:     Rate and Rhythm: Normal rate and regular rhythm.     Heart sounds: No murmur heard. Pulmonary:     Effort: Pulmonary effort is normal. No respiratory distress.     Breath sounds: Normal breath sounds.  Abdominal:     Palpations: Abdomen is soft.     Tenderness: There is no abdominal tenderness.  Musculoskeletal:        General: No swelling.     Cervical back: Neck supple.  Skin:    General: Skin is warm and dry.     Capillary Refill: Capillary refill takes less than 2 seconds.  Neurological:     Mental Status: He is alert.     Cranial Nerves: No cranial nerve deficit.     Sensory: Sensory deficit present.     Motor: No weakness.  Psychiatric:         Mood and Affect: Mood normal.    ED Results and Treatments Labs (all labs ordered are listed, but only abnormal results are displayed) Labs Reviewed  CBC - Abnormal; Notable for the following components:      Result Value   RBC 4.00 (*)    MCV 103.5 (*)    MCH 34.3 (*)    All other components within normal limits  COMPREHENSIVE METABOLIC PANEL - Abnormal; Notable for the following components:   Calcium 8.4 (*)    All other components within normal limits  URINALYSIS, ROUTINE W REFLEX MICROSCOPIC - Abnormal; Notable for the following components:   Color, Urine STRAW (*)    All other components within normal limits  RESP PANEL BY RT-PCR (FLU A&B, COVID) ARPGX2  ETHANOL  PROTIME-INR  APTT  DIFFERENTIAL  RAPID URINE DRUG SCREEN, HOSP PERFORMED  CBG MONITORING, ED                                                                                                                          Radiology MR BRAIN WO CONTRAST  Result Date: 07/03/2021 CLINICAL DATA:  Neuro deficit, acute, stroke suspected EXAM: MRI HEAD WITHOUT CONTRAST TECHNIQUE: Multiplanar, multiecho pulse sequences of the brain and surrounding structures were obtained without intravenous contrast. COMPARISON:  Same day CT head.  MRI May 14, 2021. FINDINGS: Brain: No acute infarction, hemorrhage, hydrocephalus, extra-axial collection or mass lesion. Small remote infarct in the left caudate. Vascular: Major arterial flow voids are maintained at the skull base. Skull and upper cervical spine: Normal  marrow signal. Sinuses/Orbits: Mild paranasal sinus mucosal thickening. Unremarkable orbits. Other: No mastoid effusions. IMPRESSION: 1. No evidence of acute intracranial abnormality. 2. 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 Electronically Signed   By: Feliberto HartsFrederick S Jones M.D.   On: 07/03/2021 14:02   CT HEAD CODE STROKE WO CONTRAST  Result Date: 07/03/2021 CLINICAL DATA:  Code stroke.  Left-sided weakness and  numbness EXAM: CT HEAD WITHOUT CONTRAST TECHNIQUE: Contiguous axial images were obtained from the base of the skull through the vertex without intravenous contrast. COMPARISON:  CT head 05/13/2021, MR head 05/14/2021 FINDINGS: Brain: There is no evidence of acute intracranial hemorrhage, extra-axial fluid collection, or acute infarct. Parenchymal volume is normal. The ventricles are normal in size. A small remote lacunar infarct in the left caudate head is unchanged. There is no mass lesion. There is no midline shift. Vascular: No hyperdense vessel or unexpected calcification. Skull: Normal. Negative for fracture or focal lesion. Sinuses/Orbits: Imaged paranasal sinuses are clear. The globes and orbits are unremarkable. Other: None. ASPECTS [facility]([place] Stroke Program Early CT Score) - Ganglionic level infarction (caudate, lentiform nuclei, internal capsule, insula, M1-M3 cortex): 7 - Supraganglionic infarction (M4-M6 cortex): 3 Total score (0-10 with 10 being normal): 10 IMPRESSION: 1. No acute intracranial pathology. 2. ASPECTS is 10 Electronically Signed   By: Lesia HausenPeter  Noone M.D.   On: 07/03/2021 11:22   CT ANGIO HEAD NECK W WO CM (CODE STROKE)  Result Date: 07/03/2021 CLINICAL DATA:  Left-sided weakness and numbness EXAM: CT ANGIOGRAPHY HEAD AND NECK TECHNIQUE: Multidetector CT imaging of the head and neck was performed using the standard protocol during bolus administration of intravenous contrast. Multiplanar CT image reconstructions and MIPs were obtained to evaluate the vascular anatomy. Carotid stenosis measurements (when applicable) are obtained utilizing NASCET criteria, using the distal internal carotid diameter as the denominator. CONTRAST:  75mL OMNIPAQUE IOHEXOL 350 MG/ML SOLN COMPARISON:  Same-day noncontrast CT head, MR head 05/14/2021, CTA head and neck 03/30/2021 FINDINGS: CTA NECK FINDINGS Aortic arch: Standard branching. Imaged portion shows no evidence of aneurysm or dissection. No significant  stenosis of the major arch vessel origins. Right carotid system: The right common, internal, and external carotid arteries are patent, without hemodynamically significant stenosis or occlusion. There is no dissection or aneurysm. Left carotid system: The left common, internal, and external carotid arteries are patent, without hemodynamically significant stenosis or occlusion. There is no dissection or aneurysm. There is minimal calcified plaque in the left carotid bulb. Vertebral arteries: The vertebral arteries are patent, without hemodynamically significant stenosis or occlusion. There is no dissection or aneurysm. Skeleton: There is mild degenerative change of the cervical spine. There is no visible canal hematoma. There is no acute osseous abnormality or aggressive osseous lesion. Other neck: The soft tissues are unremarkable. Upper chest: The imaged lung apices are clear. Review of the MIP images confirms the above findings CTA HEAD FINDINGS Anterior circulation: The intracranial ICAs are patent, without hemodynamically significant stenosis or occlusion. The bilateral MCAs are patent. The bilateral ACAs are patent. The anterior communicating artery is normal. There is no aneurysm. Posterior circulation: The bilateral V4 segments are patent. PICA is identified bilaterally. The basilar artery is patent. The bilateral PCAs are patent. The left posterior communicating artery is identified. The right posterior communicating artery is not seen. There is no aneurysm. Venous sinuses: As permitted by contrast timing, patent. Anatomic variants: None. Review of the MIP images confirms the above findings IMPRESSION: Patent vasculature of the head and neck with no  hemodynamically significant stenosis or occlusion. No dissection or aneurysm. Electronically Signed   By: Lesia HausenPeter  Noone M.D.   On: 07/03/2021 12:11    Pertinent labs & imaging results that were available during my care of the patient were reviewed by me and  considered in my medical decision making (see MDM for details).  Medications Ordered in ED Medications  clopidogrel (PLAVIX) tablet 75 mg (75 mg Oral Given 07/03/21 1211)  enoxaparin (LOVENOX) injection 40 mg (40 mg Subcutaneous Given 07/03/21 1356)  0.9 %  sodium chloride infusion ( Intravenous Infusion Verify 07/03/21 1507)  acetaminophen (TYLENOL) tablet 650 mg (has no administration in time range)    Or  acetaminophen (TYLENOL) 160 MG/5ML solution 650 mg (has no administration in time range)    Or  acetaminophen (TYLENOL) suppository 650 mg (has no administration in time range)  senna-docusate (Senokot-S) tablet 1 tablet (has no administration in time range)  aspirin suppository 300 mg ( Rectal See Alternative 07/03/21 1455)    Or  aspirin tablet 325 mg (325 mg Oral Given 07/03/21 1455)  atorvastatin (LIPITOR) tablet 20 mg (has no administration in time range)  pantoprazole (PROTONIX) EC tablet 40 mg (has no administration in time range)  iohexol (OMNIPAQUE) 350 MG/ML injection 100 mL (75 mLs Intravenous Contrast Given 07/03/21 1159)  prochlorperazine (COMPAZINE) injection 10 mg (10 mg Intravenous Given 07/03/21 1211)  diphenhydrAMINE (BENADRYL) injection 25 mg (25 mg Intravenous Given 07/03/21 1211)  ketorolac (TORADOL) 15 MG/ML injection 15 mg (15 mg Intravenous Given 07/03/21 1211)   stroke: mapping our early stages of recovery book ( Does not apply Given 07/03/21 1456)                                                                                                                                     Procedures Procedures  (including critical care time)  Medical Decision Making / ED Course   This patient presents to the ED for concern of numbness and weakness, this involves an extensive number of treatment options, and is a complaint that carries with it a high risk of complications and morbidity.  The differential diagnosis includes CVA, hemiplegic migraine, functional neurologic  disorder  MDM: Patient seen in the emergency department for evaluation of numbness and weakness.  Physical exam with left-sided upper and lower extremity numbness and weakness.  Stroke alert was called and the patient was taken immediately to CT where stroke imaging was reassuringly negative.  Laboratory evaluation unremarkable.  The stroke neurologist evaluated the patient and recommended admission for completion of stroke work-up including MRI and ultrasound.  Patient then admitted to the hospitalist.  Patient had persistent headache and was given headache cocktail.  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   Additional history obtained:  -External records from outside source obtained and reviewed including: Chart review including previous notes, labs, imaging, consultation notes   Lab Tests: -I ordered, reviewed, and interpreted labs.  The pertinent results  include: Negative laboratory work-up   EKG  EKG Interpretation  Date/Time:    Ventricular Rate:    PR Interval:    QRS Duration:   QT Interval:    QTC Calculation:   R Axis:     Text Interpretation:           Imaging Studies ordered: I ordered imaging studies including CT head, CT angio, MRI brain I independently visualized and interpreted imaging. I agree with the radiologist interpretation   Medicines ordered and prescription drug management: Meds ordered this encounter  Medications   iohexol (OMNIPAQUE) 350 MG/ML injection 100 mL   clopidogrel (PLAVIX) tablet 75 mg   prochlorperazine (COMPAZINE) injection 10 mg   diphenhydrAMINE (BENADRYL) injection 25 mg   ketorolac (TORADOL) 15 MG/ML injection 15 mg   enoxaparin (LOVENOX) injection 40 mg    stroke: mapping our early stages of recovery book   0.9 %  sodium chloride infusion   OR Linked Order Group    acetaminophen (TYLENOL) tablet 650 mg    acetaminophen (TYLENOL) 160 MG/5ML solution 650 mg    acetaminophen (TYLENOL) suppository 650 mg   senna-docusate (Senokot-S)  tablet 1 tablet   OR Linked Order Group    aspirin suppository 300 mg    aspirin tablet 325 mg   atorvastatin (LIPITOR) tablet 20 mg   pantoprazole (PROTONIX) EC tablet 40 mg    -Reevaluation of the patient after these medicines showed that the patient stayed the same -I have reviewed the patients home medicines and have made adjustments as needed  Critical interventions Activation of stroke alert  Consultations Obtained: I requested consultation with the neurology,  and discussed lab and imaging findings as well as pertinent plan - they recommend: Admission, MRI, echo   Cardiac Monitoring: The patient was maintained on a cardiac monitor.  I personally viewed and interpreted the cardiac monitored which showed an underlying rhythm of: Normal sinus rhythm   Reevaluation: After the interventions noted above, I reevaluated the patient and found that they have :stayed the same  Co morbidities that complicate the patient evaluation  Past Medical History:  Diagnosis Date   Hypertension    MI (myocardial infarction) (HCC)       Dispostion: Admission     Final Clinical Impression(s) / ED Diagnoses Final diagnoses:  None     @PCDICTATION @    , MD 07/03/21 1601

## 2021-07-03 NOTE — ED Notes (Signed)
Neurologist told Corrie Dandy, RN that he was going to order 325 mg of aspirin for pt

## 2021-07-03 NOTE — ED Notes (Signed)
Pt at MRI

## 2021-07-03 NOTE — Consult Note (Signed)
[facility] TELEMEDICINE  CONSULT   Date of service: July 03, 2021 Patient Name: Marcus Dunn MRN:  [id number] DOB:  10-22-1965 Requesting Provider: Dr. Matilde Sprang Location of the provider: [facility] ED Location of the patient: [facility] ED Reason for consult: "stroke"   This consult was provided via telemedicine with 2-way audio communication. The patient/family was informed that care would be provided in this way and agreed to receive care in this manner.   History of Present Illness  Marcus Dunn is a 56 y.o. male with PMH significant for  has a past medical history of Hypertension and MI (myocardial infarction) ([place]). who presents with mild left sided weakness and numbness and severe headache.   Stroke Measures   Last Known Well: 1020 TPA Given: no low NIHSS IR Thrombectomy: no large vessel occlusion mRS: Modified Rankin Scale: 0-Completely asymptomatic and back to baseline post- stroke Time of teleneurologist evaluation: 1120   Vitals   Vitals:   07/03/21 1103 07/03/21 1119 07/03/21 1130 07/03/21 1200  BP:   (!) 150/87 132/78  Pulse:  66 73 69  Resp:  12 18 16   Temp:  98 F (36.7 C)    TempSrc:  Oral    SpO2:   99% 98%  Weight: 92.1 kg     Height: 6\' 4"  (1.93 m)        Body mass index is 24.72 kg/m.   Tele-neuro Exam and NIHSS   General: mild distress due to headache  LOC Responsiveness 0 LOC Questions 0 LOC Commands 0 Horizontal eye movement 0 Visual field 0 Facial palsy 0 Motor arm - Right arm 0 Motor arm - Left arm 1 Motor leg - Right leg 0 Motor leg - Left leg 1 Limb ataxia 0 Sensory test 1 Language 0 Speech 0 Extinction and inattention 0 NIHSS Score 3   Imaging and Labs   CBC:  Recent Labs  Lab 07/03/21 1125  WBC 4.6  NEUTROABS 2.3  HGB 13.7  HCT 41.4  MCV 103.5*  PLT 185    Lipid Panel:  Lab Results  Component Value Date   LDLCALC 124 (H) 05/14/2021   HgbA1c:  Lab Results  Component Value Date   HGBA1C 4.4 (L)  03/31/2021   Urine Drug Screen: No results found for: LABOPIA, COCAINSCRNUR, LABBENZ, AMPHETMU, THCU, LABBARB  Alcohol Level No results found for: [facility]  Results for orders placed during the hospital encounter of 07/03/21  CT HEAD CODE STROKE WO CONTRAST  Narrative CLINICAL DATA:  Code stroke.  Left-sided weakness and numbness  EXAM: CT HEAD WITHOUT CONTRAST  TECHNIQUE: Contiguous axial images were obtained from the base of the skull through the vertex without intravenous contrast.  COMPARISON:  CT head 05/13/2021, MR head 05/14/2021  FINDINGS: Brain: There is no evidence of acute intracranial hemorrhage, extra-axial fluid collection, or acute infarct.  Parenchymal volume is normal. The ventricles are normal in size. A small remote lacunar infarct in the left caudate head is unchanged. There is no mass lesion. There is no midline shift.  Vascular: No hyperdense vessel or unexpected calcification.  Skull: Normal. Negative for fracture or focal lesion.  Sinuses/Orbits: Imaged paranasal sinuses are clear. The globes and orbits are unremarkable.  Other: None.  ASPECTS [facility] Stroke Program Early CT Score)  - Ganglionic level infarction (caudate, lentiform nuclei, internal capsule, insula, M1-M3 cortex): 7  - Supraganglionic infarction (M4-M6 cortex): 3  Total score (0-10 with 10 being normal): 10  IMPRESSION: 1. No acute intracranial pathology. 2. ASPECTS  is 10   Electronically Signed By: Valetta Mole M.D. On: 07/03/2021 11:22   Impression   Marcus Dunn is a 56 y.o. right handed male with PMHx as noted above with mild left upper and lower extremity weakness and mild decreased sensation on left and severe headache. His limited tele-neurologic examination was notable for very mild drift in the left arm and leg to an intermediate position without hitting the bed. Due to mild nondisabling symptoms recommended holding off on tNK and giving aspirin 324mg  and  clopidogrel 75mg  STAT.   CTA head and neck ordered and reviewed which showed no intervenable large vessel occlusion or significant flow limiting stenosis.  Recommendations    - MRI brain without contrast. - Recommend TTE. - Recommend labs: HbA1c, lipid panel. - Recommend Statin if LDL > 70 - Continue aspirin 81mg  daily. - Continue clopidogrel 75mg  daily for 3 weeks. - Permissive hypertension SBP goal <180. - Telemetry monitoring for arrhythmia. - Recommend bedside Swallow screen. - Recommend Stroke education. - Recommend PT/OT/SLP consult.  Electronically signed by:  Lynnae Sandhoff, MD Page: [phone] 07/03/2021, 12:09 PM  This patient is receiving care for possible acute neurological changes. There was 63 minutes of care by this provider at the time of service, including time for direct evaluation via telemedicine, review of medical records, imaging studies and discussion of findings with providers, the patient and/or family.

## 2021-07-03 NOTE — ED Triage Notes (Addendum)
Pt to the ED to rule out a CVA. Last known well was at 1020. Pt complains of left side weakness and a severe left side HA with numbness. EDP notified at 63

## 2021-07-03 NOTE — H&P (Signed)
History and Physical    Marcus Dunn [id number] DOB: 15-Nov-1965 DOA: 07/03/2021  PCP: Pcp, No   [facility]  I have personally briefly reviewed patient's old medical records in [place]  Chief Complaint: Left-sided weakness/headache  HPI: Marcus Dunn is a 56 y.o. male with medical history significant of hypertension, hyperlipidemia and prior history of myocardial infarction; who presented to the emergency department secondary to left-sided weakness, numbness/tingling and severe headache.  Patient was last seen normal on 07/02/2021 around 10 PM.  Patient without dysarthria, mild drift in his left upper arm and left lower extremity with a muscular strength of 4 out of 5 during physical examination.  Denies chest pain, nausea, vomiting, shortness of breath, dysuria, hematuria, melena, hematochezia or any other complaints.  CT scan in the ED demonstrated no acute intracranial normalities; CT angiogram of head and neck without large vessel occlusion or significant stenosis.  Patient has been seen by neurology (teleneurology) with recommendations for admission to the hospital for MRI and complete stroke work-up.  Patient received aspirin and Plavix; TRH has been contacted to place in the hospital for further evaluation and management.  Review of Systems: As per HPI otherwise all other systems reviewed and are negative.  Past Medical History:  Diagnosis Date   Hypertension    MI (myocardial infarction) [facility])     Past Surgical History:  Procedure Laterality Date   HERNIA REPAIR     KNEE SURGERY     SHOULDER SURGERY      Social History  reports that he has never smoked. He has never used smokeless tobacco. He reports that he does not currently use alcohol. He reports that he does not currently use drugs.  Allergies  Allergen Reactions   Chocolate Rash   Hydrochlorothiazide Rash   Lisinopril Rash   Penicillins Rash    Family history:  Patient reports family history of hypertension; otherwise noncontributory.  Prior to Admission medications   Medication Sig Start Date End Date Taking? Authorizing Provider  aspirin EC 81 MG EC tablet Take 1 tablet (81 mg total) by mouth daily. Swallow whole. 05/15/21  Yes Johnson, Clanford L, MD  atorvastatin (LIPITOR) 10 MG tablet Take 1 tablet (10 mg total) by mouth every evening. 05/14/21 05/14/22 Yes Johnson, Clanford L, MD  Cholecalciferol (VITAMIN D3) 1.25 MG (50000 UT) CAPS Take 1 capsule by mouth once a week.   Yes [provider]  ibuprofen (MOTRIN IB) 200 MG tablet Take 1 tablet (200 mg total) by mouth every 8 (eight) hours as needed for up to 20 doses for mild pain. 04/01/21  Yes Elgergawy, Silver Huguenin, MD  omeprazole (PRILOSEC OTC) 20 MG tablet Take 1 tablet (20 mg total) by mouth daily with breakfast. 05/14/21  Yes Johnson, Clanford L, MD  vitamin B-12 (CYANOCOBALAMIN) 500 MCG tablet Take 1 tablet (500 mcg total) by mouth daily. Patient not taking: Reported on 07/03/2021 04/01/21   Elgergawy, Silver Huguenin, MD    Physical Exam: Vitals:   07/03/21 1200 07/03/21 1230 07/03/21 1300 07/03/21 1414  BP: 132/78 123/84 124/79 132/89  Pulse: 69 77 61 67  Resp: 16 12 13 18   Temp:    97.6 F (36.4 C)  TempSrc:    Oral  SpO2: 98% 97% 97% 100%  Weight:      Height:        Constitutional: NAD, calm, comfortable; denies chest pain, afebrile, no nausea, no vomiting. Vitals:   07/03/21 1200 07/03/21 1230 07/03/21  1300 07/03/21 1414  BP: 132/78 123/84 124/79 132/89  Pulse: 69 77 61 67  Resp: 16 12 13 18   Temp:    97.6 F (36.4 C)  TempSrc:    Oral  SpO2: 98% 97% 97% 100%  Weight:      Height:       Eyes: PERRL, lids and conjunctivae normal, no icterus, no nystagmus. ENMT: Mucous membranes are moist. Posterior pharynx clear of any exudate or lesions. Neck: normal, supple, no masses, no thyromegaly; no JVD. Respiratory: clear to auscultation bilaterally, no wheezing, no crackles.  Normal respiratory effort. No accessory muscle use.  Cardiovascular: Regular rate and rhythm, no murmurs / rubs / gallops. No extremity edema. 2+ pedal pulses. No carotid bruits.  Abdomen: no tenderness, no masses palpated. No hepatosplenomegaly. Bowel sounds positive.  Musculoskeletal: no clubbing / cyanosis. No joint deformity upper and lower extremities. Good ROM, no contractures. Normal muscle tone.  Skin: no rashes, no petechiae. Neurologic: CN 2-12 grossly intact.  No dysarthria, no facial droop.  Patient reporting left-sided weakness, tingling/numbness and some improvement in his left-sided headache after analgesics received. Psychiatric: Normal judgment and insight. Alert and oriented x 3. Normal mood.   Labs on Admission: I have personally reviewed following labs and imaging studies  CBC: Recent Labs  Lab 07/03/21 1125  WBC 4.6  NEUTROABS 2.3  HGB 13.7  HCT 41.4  MCV 103.5*  PLT 123XX123    Basic Metabolic Panel: Recent Labs  Lab 07/03/21 1125  NA 140  K 4.1  CL 108  CO2 24  GLUCOSE 77  BUN 15  CREATININE 1.14  CALCIUM 8.4*    GFR: Estimated Creatinine Clearance: 88.8 mL/min (by C-G formula based on SCr of 1.14 mg/dL).  Liver Function Tests: Recent Labs  Lab 07/03/21 1125  AST 28  ALT 24  ALKPHOS 65  BILITOT 1.2  PROT 6.8  ALBUMIN 3.9    Urine analysis:    Component Value Date/Time   COLORURINE STRAW (A) 07/03/2021 1217   APPEARANCEUR CLEAR 07/03/2021 1217   LABSPEC 1.009 07/03/2021 1217   PHURINE 6.0 07/03/2021 1217   GLUCOSEU NEGATIVE 07/03/2021 1217   [place] NEGATIVE 07/03/2021 1217   [place] 07/03/2021 1217   KETONESUR NEGATIVE 07/03/2021 1217   PROTEINUR NEGATIVE 07/03/2021 1217   NITRITE NEGATIVE 07/03/2021 1217   LEUKOCYTESUR NEGATIVE 07/03/2021 1217    Radiological Exams on Admission: MR BRAIN WO CONTRAST  Result Date: 07/03/2021 CLINICAL DATA:  Neuro deficit, acute, stroke suspected EXAM: MRI HEAD WITHOUT CONTRAST  TECHNIQUE: Multiplanar, multiecho pulse sequences of the brain and surrounding structures were obtained without intravenous contrast. COMPARISON:  Same day CT head.  MRI May 14, 2021. FINDINGS: Brain: No acute infarction, hemorrhage, hydrocephalus, extra-axial collection or mass lesion. Small remote infarct in the left caudate. Vascular: Major arterial flow voids are maintained at the skull base. Skull and upper cervical spine: Normal marrow signal. Sinuses/Orbits: Mild paranasal sinus mucosal thickening. Unremarkable orbits. Other: No mastoid effusions. IMPRESSION: 1. No evidence of acute intracranial abnormality. 2. 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 Electronically Signed   By: Margaretha Sheffield M.D.   On: 07/03/2021 14:02   CT HEAD CODE STROKE WO CONTRAST  Result Date: 07/03/2021 CLINICAL DATA:  Code stroke.  Left-sided weakness and numbness EXAM: CT HEAD WITHOUT CONTRAST TECHNIQUE: Contiguous axial images were obtained from the base of the skull through the vertex without intravenous contrast. COMPARISON:  CT head 05/13/2021, MR head 05/14/2021 FINDINGS: Brain: There is no evidence of  acute intracranial hemorrhage, extra-axial fluid collection, or acute infarct. Parenchymal volume is normal. The ventricles are normal in size. A small remote lacunar infarct in the left caudate head is unchanged. There is no mass lesion. There is no midline shift. Vascular: No hyperdense vessel or unexpected calcification. Skull: Normal. Negative for fracture or focal lesion. Sinuses/Orbits: Imaged paranasal sinuses are clear. The globes and orbits are unremarkable. Other: None. ASPECTS [facility] Stroke Program Early CT Score) - Ganglionic level infarction (caudate, lentiform nuclei, internal capsule, insula, M1-M3 cortex): 7 - Supraganglionic infarction (M4-M6 cortex): 3 Total score (0-10 with 10 being normal): 10 IMPRESSION: 1. No acute intracranial pathology. 2. ASPECTS is 10 Electronically Signed   By:  Valetta Mole M.D.   On: 07/03/2021 11:22   CT ANGIO HEAD NECK W WO CM (CODE STROKE)  Result Date: 07/03/2021 CLINICAL DATA:  Left-sided weakness and numbness EXAM: CT ANGIOGRAPHY HEAD AND NECK TECHNIQUE: Multidetector CT imaging of the head and neck was performed using the standard protocol during bolus administration of intravenous contrast. Multiplanar CT image reconstructions and MIPs were obtained to evaluate the vascular anatomy. Carotid stenosis measurements (when applicable) are obtained utilizing NASCET criteria, using the distal internal carotid diameter as the denominator. CONTRAST:  70mL OMNIPAQUE IOHEXOL 350 MG/ML SOLN COMPARISON:  Same-day noncontrast CT head, MR head 05/14/2021, CTA head and neck 03/30/2021 FINDINGS: CTA NECK FINDINGS Aortic arch: Standard branching. Imaged portion shows no evidence of aneurysm or dissection. No significant stenosis of the major arch vessel origins. Right carotid system: The right common, internal, and external carotid arteries are patent, without hemodynamically significant stenosis or occlusion. There is no dissection or aneurysm. Left carotid system: The left common, internal, and external carotid arteries are patent, without hemodynamically significant stenosis or occlusion. There is no dissection or aneurysm. There is minimal calcified plaque in the left carotid bulb. Vertebral arteries: The vertebral arteries are patent, without hemodynamically significant stenosis or occlusion. There is no dissection or aneurysm. Skeleton: There is mild degenerative change of the cervical spine. There is no visible canal hematoma. There is no acute osseous abnormality or aggressive osseous lesion. Other neck: The soft tissues are unremarkable. Upper chest: The imaged lung apices are clear. Review of the MIP images confirms the above findings CTA HEAD FINDINGS Anterior circulation: The intracranial ICAs are patent, without hemodynamically significant stenosis or occlusion.  The bilateral MCAs are patent. The bilateral ACAs are patent. The anterior communicating artery is normal. There is no aneurysm. Posterior circulation: The bilateral V4 segments are patent. PICA is identified bilaterally. The basilar artery is patent. The bilateral PCAs are patent. The left posterior communicating artery is identified. The right posterior communicating artery is not seen. There is no aneurysm. Venous sinuses: As permitted by contrast timing, patent. Anatomic variants: None. Review of the MIP images confirms the above findings IMPRESSION: Patent vasculature of the head and neck with no hemodynamically significant stenosis or occlusion. No dissection or aneurysm. Electronically Signed   By: Valetta Mole M.D.   On: 07/03/2021 12:11    EKG: Independently reviewed.  No acute ischemic changes; sinus rhythm.  Normal QT.  Assessment/Plan 1-TIA/stroke-like symptoms -CT head and CT angiogram no revealing hemorrhagic stroke, acute intracranial abnormalities or large vessel occlusion. -Will follow MRI results -Admit patient to telemetry, check 2D echo, lipid panel, A1c and follow neurology recommendation -Patient has passed swallowing evaluation in the ED and a heart healthy diet has been requested -Will allow for permissive hypertension and provide fluid resuscitation overnight. -Continue aspirin  and Plavix. -PT and OT evaluation requested. -Continue telemetry monitoring.  2-hypertension -Allow for permissive hypertension in the setting of acute ischemic concerns. -Follow vital signs.  3-gastroesophageal flux disease -Continue PPI.  4-hyperlipidemia -Will check lipid panel and continue statins.  5-history of myocardial infarction/CAD -Continue aspirin and statin -Resume beta-blocker after 24 hours of permissive hypertension. -Patient denies chest pain or shortness of breath. -Telemetry monitoring -Echo will be done as part of stroke work-up.     DVT prophylaxis: Lovenox Code  Status:   Full code Family Communication:  No family at bedside. Disposition Plan:   Patient is from:  Corrective facility  Anticipated DC to:  Back to same corrective facility he came from.  Anticipated DC date:  07/04/21  Anticipated DC barriers: Stroke work-up completion  Consults called:  Neurology Admission status:  Observation, telemetry, length of stay less than 2 midnights.  Severity of Illness: The appropriate patient status for this patient is OBSERVATION. Observation status is judged to be reasonable and necessary in order to provide the required intensity of service to ensure the patient's safety. The patient's presenting symptoms, physical exam findings, and initial radiographic and laboratory data in the context of their medical condition is felt to place them at decreased risk for further clinical deterioration. Furthermore, it is anticipated that the patient will be medically stable for discharge from the hospital within 2 midnights of admission.     Barton Dubois MD [facility]  How to contact the [facility] Attending or Consulting provider [place] or covering provider during after hours [place], for this patient?   Check the care team in [facility] and look for a) attending/consulting [facility] provider listed and b) the [facility] team listed Log into [email or web address] and use [facility]'s universal password to access. If you do not have the password, please contact the hospital operator. Locate the [facility] provider you are looking for under [facility] and page to a number that you can be directly reached. If you still have difficulty reaching the provider, please page the [facility] (Director on Call) for the Hospitalists listed on amion for assistance.  07/03/2021, 2:17 PM

## 2021-07-04 ENCOUNTER — Observation Stay (HOSPITAL_BASED_OUTPATIENT_CLINIC_OR_DEPARTMENT_OTHER)

## 2021-07-04 DIAGNOSIS — I10 Essential (primary) hypertension: Secondary | ICD-10-CM

## 2021-07-04 DIAGNOSIS — R2990 Unspecified symptoms and signs involving the nervous system: Secondary | ICD-10-CM | POA: Diagnosis not present

## 2021-07-04 LAB — LIPID PANEL
Cholesterol: 168 mg/dL (ref 0–200)
HDL: 43 mg/dL (ref >40)
LDL Cholesterol: 116 mg/dL — ABNORMAL HIGH (ref 0–99)
Total CHOL/HDL Ratio: 3.9 RATIO
Triglycerides: 47 mg/dL (ref <150)
VLDL: 9 mg/dL (ref 0–40)

## 2021-07-04 LAB — HEMOGLOBIN A1C
Hgb A1c MFr Bld: 4.6 % — ABNORMAL LOW (ref 4.8–5.6)
Mean Plasma Glucose: 85.32 mg/dL

## 2021-07-04 LAB — ECHOCARDIOGRAM COMPLETE
AR max vel: 2.97 cm2
AV Area VTI: 3.43 cm2
AV Area mean vel: 2.9 cm2
AV Mean grad: 5 mmHg
AV Peak grad: 10.4 mmHg
Ao pk vel: 1.61 m/s
Area-P 1/2: 2.46 cm2
Calc EF: 70 %
Height: 76 in
MV VTI: 3.81 cm2
S' Lateral: 3.4 cm
Single Plane A2C EF: 74.7 %
Single Plane A4C EF: 64.2 %
Weight: 3248.7 oz

## 2021-07-04 LAB — HIV ANTIBODY (ROUTINE TESTING W REFLEX): HIV Screen 4th Generation wRfx: NONREACTIVE

## 2021-07-04 LAB — FOLATE: Folate: 23.5 ng/mL (ref >5.9)

## 2021-07-04 LAB — T4, FREE: Free T4: 0.81 ng/dL (ref 0.61–1.12)

## 2021-07-04 LAB — TSH: TSH: 0.558 u[IU]/mL (ref 0.350–4.500)

## 2021-07-04 LAB — VITAMIN B12: Vitamin B-12: 319 pg/mL (ref 180–914)

## 2021-07-04 LAB — VITAMIN D 25 HYDROXY (VIT D DEFICIENCY, FRACTURES): Vit D, 25-Hydroxy: 37.7 ng/mL (ref 30–100)

## 2021-07-04 MED ORDER — ATORVASTATIN CALCIUM 20 MG PO TABS: 20.0000 mg | ORAL_TABLET | Freq: Every evening | ORAL | 1 refills | Status: AC

## 2021-07-04 MED ORDER — ASPIRIN EC 81 MG PO TBEC
81.0000 mg | DELAYED_RELEASE_TABLET | Freq: Every day | ORAL | Status: DC
  Administered 2021-07-04: 81 mg via ORAL
  Filled 2021-07-04: qty 1

## 2021-07-04 NOTE — Progress Notes (Signed)
OT Cancellation Note  Patient Details Name: Marcus Dunn MRN: [id number] DOB: 11/05/1965   Cancelled Treatment:    Reason Eval/Treat Not Completed: OT screened, no needs identified, will sign off. Pt able to remove covers and transfer to standing and ambulation in hall with PT without physical assist. Pt demonstrates WFL B UE strength via MMT.   Woodroe Vogan OT, MOT   Danie Chandler 07/04/2021, 9:14 AM

## 2021-07-04 NOTE — Progress Notes (Signed)
SLP Cancellation Note  Patient Details Name: ALCIDES DORCELY MRN: [id number] DOB: [birth year]   Cancelled treatment:       Reason Eval/Treat Not Completed: SLP screened, no needs identified, will sign off; SLP screened Pt in room. Pt denies any changes in swallowing, speech, language, or cognition. MRI negative for acute changes. SLE will be deferred at this time. Reconsult if indicated. SLP will sign off.   Thank you,  Genene Churn, [place]    [place] 07/04/2021, 1:26 PM

## 2021-07-04 NOTE — Progress Notes (Incomplete)
*  PRELIMINARY RESULTS* Echocardiogram 2D Echocardiogram has been performed.  Marcus Dunn 07/04/2021, 9:22 AM

## 2021-07-04 NOTE — Evaluation (Signed)
Physical Therapy Evaluation Patient Details Name: Marcus Dunn MRN: [id number] DOB: 03/11/1966 Today's Date: 07/04/2021  History of Present Illness  Marcus Dunn is a 56 y.o. male with medical history significant of hypertension, hyperlipidemia and prior history of myocardial infarction; who presented to the emergency department secondary to left-sided weakness, numbness/tingling and severe headache.  Patient was last seen normal on 07/02/2021 around 10 PM.  Patient without dysarthria, mild drift in his left upper arm and left lower extremity with a muscular strength of 4 out of 5 during physical examination.  Denies chest pain, nausea, vomiting, shortness of breath, dysuria, hematuria, melena, hematochezia or any other complaints.   Clinical Impression  Patient functioning at baseline for functional mobility and gait.  Plan:  Patient discharged from physical therapy to care of nursing for ambulation daily as tolerated for length of stay.         Recommendations for follow up therapy are one component of a multi-disciplinary discharge planning process, led by the attending physician.  Recommendations may be updated based on patient status, additional functional criteria and insurance authorization.  Follow Up Recommendations No PT follow up    Assistance Recommended at Discharge None  Patient can return home with the following  Other (comment) (Patient at baseline)    Equipment Recommendations None recommended by PT  Recommendations for Other Services       Functional Status Assessment Patient has not had a recent decline in their functional status     Precautions / Restrictions Precautions Precautions: None Restrictions Weight Bearing Restrictions: No      Mobility  Bed Mobility Overal bed mobility: Independent                  Transfers Overall transfer level: Independent                      Ambulation/Gait Ambulation/Gait assistance:  Independent Gait Distance (Feet): 200 Feet   Gait Pattern/deviations: WFL(Within Functional Limits) Gait velocity: normal     General Gait Details: demonstrates good return for ambulation in room and hallway without loss of balance  Stairs            Wheelchair Mobility    Modified Rankin (Stroke Patients Only)       Balance Overall balance assessment: Independent                                           Pertinent Vitals/Pain Pain Assessment: No/denies pain    Home Living Family/patient expects to be discharged to:: [facility]                        Prior Function               Mobility Comments: Independent ambulator without AD ADLs Comments: Independent     Hand Dominance   Dominant Hand: Right    Extremity/Trunk Assessment   Upper Extremity Assessment Upper Extremity Assessment: Defer to OT evaluation    Lower Extremity Assessment Lower Extremity Assessment: Overall WFL for tasks assessed    Cervical / Trunk Assessment Cervical / Trunk Assessment: Normal  Communication   Communication: No difficulties  Cognition Arousal/Alertness: Awake/alert Behavior During Therapy: WFL for tasks assessed/performed Overall Cognitive Status: Within Functional Limits for tasks assessed  General Comments      Exercises     Assessment/Plan    PT Assessment Patient does not need any further PT services  PT Problem List         PT Treatment Interventions      PT Goals (Current goals can be found in the Care Plan section)  Acute Rehab PT Goals Patient Stated Goal: return home PT Goal Formulation: With patient Time For Goal Achievement: 07/04/21 Potential to Achieve Goals: Good    Frequency       Co-evaluation PT/OT/SLP Co-Evaluation/Treatment: Yes Reason for Co-Treatment: To address functional/ADL transfers PT goals addressed during session:  Mobility/safety with mobility;Balance         AM-PAC PT "6 Clicks" Mobility  Outcome Measure Help needed turning from your back to your side while in a flat bed without using bedrails?: None Help needed moving from lying on your back to sitting on the side of a flat bed without using bedrails?: None Help needed moving to and from a bed to a chair (including a wheelchair)?: None Help needed standing up from a chair using your arms (e.g., wheelchair or bedside chair)?: None Help needed to walk in hospital room?: None Help needed climbing 3-5 steps with a railing? : None 6 Click Score: 24    End of Session   Activity Tolerance: Patient tolerated treatment well Patient left: in bed;with call bell/phone within reach;with family/visitor present Nurse Communication: Mobility status PT Visit Diagnosis: Unsteadiness on feet (R26.81);Other abnormalities of gait and mobility (R26.89);Muscle weakness (generalized) (M62.81)    Time: [id number] PT Time Calculation (min) (ACUTE ONLY): 15 min   Charges:   PT Evaluation $PT Eval Low Complexity: 1 Low PT Treatments $Therapeutic Activity: 8-22 mins        9:20 AM, 07/04/21 Lonell Grandchild, MPT Physical Therapist with [facility] [phone] office [phone] mobile phone

## 2021-07-04 NOTE — Discharge Summary (Signed)
Physician Discharge Summary  Marcus Dunn [id number] DOB: 1965/06/25 DOA: 07/03/2021  PCP: Merryl Hacker, No  Admit date: 07/03/2021 Discharge date: 07/04/2021  Admitted From: [place] Disposition:  [facility]  Recommendations for Outpatient Follow-up:  Follow up with PCP in 1-2 weeks Please obtain BMP/CBC in one week     Discharge Condition: Stable CODE STATUS: FULL Diet recommendation: Heart Healthy    Brief/Interim Summary: 56 year old male with a history of hypertension, hyperlipidemia, MI presenting with acute onset of left-sided numbness and tingling with some weakness that began around 10:30 AM on 07/03/2020 while he was working.  He stated that the episode lasted till around 5 PM at which time his deficits completely resolved.  The patient states that he had an associated severe headache in the left frontal parietal area.  He denies any visual disturbance, dysarthria.  Notably, the patient had a hospital admission from 05/13/2021 to 05/14/2021 with similar symptoms.  MRI of the brain was negative at that time.  He was discharged back to his facility with aspirin and atorvastatin.  He states that he has been taking his medications faithfully.  He denies any recent injuries or falls or neck pain.  He states that he has had some intermittent numbness and tingling in his left arm and left leg of various durations with occasional associated headaches since his last admission to the hospital.  In addition, the patient had a similar symptoms during his hospital admission from 03/30/2021 to 04/01/2021.  Including this admission, the patient has had 3 normal MRIs of his brain.  03/30/2021 CTA head and neck was negative for LVO or hemodynamically significant stenosis.  03/31/2021 echo showed EF 65 to 70% with no WMA. Patient denies fevers, chills, headache, chest pain, dyspnea, nausea, vomiting, diarrhea, abdominal pain, dysuria, hematuria, hematochezia, and melena.   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 Swallow whole.   atorvastatin 20 MG tablet Commonly known as: LIPITOR Take 1 tablet (20 mg total) by mouth every evening. What changed:  medication strength how much to take   ibuprofen 200 MG tablet Commonly known as: Motrin IB Take 1 tablet (200 mg total) by mouth every 8 (eight) hours as needed for up to 20 doses for mild pain.   omeprazole 20 MG tablet Commonly known as: PriLOSEC OTC Take 1 tablet (20 mg total) by mouth daily with breakfast.   vitamin B-12 500 MCG tablet Commonly known as: CYANOCOBALAMIN Take 1 tablet (500 mcg total) by mouth daily.   Vitamin D3 1.25 MG (50000 UT) Caps Take 1 capsule by mouth once a week.        Allergies  Allergen Reactions   Chocolate Rash   Hydrochlorothiazide Rash   Lisinopril Rash   Penicillins Rash    Consultations: none   Procedures/Studies: MR BRAIN WO CONTRAST  Result Date: 07/03/2021 CLINICAL  DATA:  Neuro deficit, acute, stroke suspected EXAM: MRI HEAD WITHOUT CONTRAST TECHNIQUE: Multiplanar, multiecho pulse sequences of the brain and surrounding structures were obtained without intravenous contrast. COMPARISON:  Same day CT head.  MRI May 14, 2021. FINDINGS: Brain: No acute infarction, hemorrhage, hydrocephalus,  extra-axial collection or mass lesion. Small remote infarct in the left caudate. Vascular: Major arterial flow voids are maintained at the skull base. Skull and upper cervical spine: Normal marrow signal. Sinuses/Orbits: Mild paranasal sinus mucosal thickening. Unremarkable orbits. Other: No mastoid effusions. IMPRESSION: 1. No evidence of acute intracranial abnormality. 2. 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 Electronically Signed   By: Margaretha Sheffield M.D.   On: 07/03/2021 14:02   ECHOCARDIOGRAM COMPLETE  Result Date: 07/04/2021    ECHOCARDIOGRAM REPORT   Patient Name:   Marcus Dunn Date of Exam: 07/04/2021 Medical Rec #:  [id number]       Height:       76.0 in Accession #:    [id number]      Weight:       203.0 lb Date of Birth:  [birth year]       BSA:          2.229 m Patient Age:    37 years        BP:           129/76 mmHg Patient Gender: M               HR:           65 bpm. Exam Location:  [facility] Procedure: 2D Echo, Cardiac Doppler and Color Doppler Indications:    Stroke  History:        Patient has prior history of Echocardiogram examinations, most                 recent 03/31/2021. Previous Myocardial Infarction,                 Signs/Symptoms:Syncope and Chest Pain; Risk                 Factors:Hypertension.  Sonographer:    Wenda Low Referring Phys: [place]  1. Left ventricular ejection fraction, by estimation, is 65 to 70%. The left ventricle has normal function. The left ventricle has no regional wall motion abnormalities. There is moderate asymmetric left ventricular hypertrophy of the basal-septal segment. Left ventricular diastolic parameters were normal.  2. Right ventricular systolic function is normal. The right ventricular size is mildly enlarged.  3. Right atrial size was mildly dilated.  4. The mitral valve is normal in structure. Trivial mitral valve regurgitation. No evidence of mitral stenosis.  5. The aortic valve is tricuspid. Aortic  valve regurgitation is not visualized. No aortic stenosis is present. FINDINGS  Left Ventricle: Left ventricular ejection fraction, by estimation, is 65 to 70%. The left ventricle has normal function. The left ventricle has no regional wall motion abnormalities. The left ventricular internal cavity size was normal in size. There is  moderate asymmetric left ventricular hypertrophy of the basal-septal segment. Left ventricular diastolic parameters were normal. Right Ventricle: The right ventricular size is mildly enlarged. No increase in right ventricular wall thickness. Right ventricular systolic function is normal. Left Atrium: Left atrial size was normal in size. Right Atrium: Right atrial size was mildly dilated. Pericardium: There is no evidence of pericardial effusion. Mitral Valve: The mitral valve is normal in structure. Trivial mitral valve regurgitation. No evidence of mitral valve  stenosis. MV peak gradient, 3.3 mmHg. The mean mitral valve gradient is 1.0 mmHg. Tricuspid Valve: The tricuspid valve is normal in structure. Tricuspid valve regurgitation is trivial. Aortic Valve: The aortic valve is tricuspid. Aortic valve regurgitation is not visualized. No aortic stenosis is present. Aortic valve mean gradient measures 5.0 mmHg. Aortic valve peak gradient measures 10.4 mmHg. Aortic valve area, by VTI measures 3.43  cm. Pulmonic Valve: The pulmonic valve was grossly normal. Pulmonic valve regurgitation is not visualized. Aorta: The aortic root and ascending aorta are structurally normal, with no evidence of dilitation. IAS/Shunts: The interatrial septum was not well visualized.  LEFT VENTRICLE PLAX 2D LVIDd:         4.60 cm     Diastology LVIDs:         3.40 cm     LV e' medial:    7.07 cm/s LV PW:         1.50 cm     LV E/e' medial:  9.8 LV IVS:        1.50 cm     LV e' lateral:   12.30 cm/s LVOT diam:     2.00 cm     LV E/e' lateral: 5.6 LV SV:         114 LV SV Index:   51 LVOT Area:     3.14 cm  LV  Volumes (MOD) LV vol d, MOD A2C: 72.8 ml LV vol d, MOD A4C: 86.6 ml LV vol s, MOD A2C: 18.4 ml LV vol s, MOD A4C: 31.0 ml LV SV MOD A2C:     54.4 ml LV SV MOD A4C:     86.6 ml LV SV MOD BP:      58.5 ml RIGHT VENTRICLE RV Basal diam:  4.00 cm RV Mid diam:    3.50 cm RV S prime:     17.30 cm/s TAPSE (M-mode): 2.8 cm LEFT ATRIUM             Index        RIGHT ATRIUM           Index LA diam:        4.10 cm 1.84 cm/m   RA Area:     22.50 cm LA Vol (A2C):   61.1 ml 27.41 ml/m  RA Volume:   73.70 ml  33.06 ml/m LA Vol (A4C):   75.0 ml 33.64 ml/m LA Biplane Vol: 67.8 ml 30.41 ml/m  AORTIC VALVE                     PULMONIC VALVE AV Area (Vmax):    2.97 cm      PV Vmax:       0.85 m/s AV Area (Vmean):   2.90 cm      PV Peak grad:  2.9 mmHg AV Area (VTI):     3.43 cm AV Vmax:           161.00 cm/s AV Vmean:          103.000 cm/s AV VTI:            0.333 m AV Peak Grad:      10.4 mmHg AV Mean Grad:      5.0 mmHg LVOT Vmax:         152.00 cm/s LVOT Vmean:        95.100 cm/s LVOT VTI:          0.364 m LVOT/AV VTI ratio: 1.09  AORTA Ao  Root diam: 3.40 cm Ao Asc diam:  3.50 cm MITRAL VALVE               TRICUSPID VALVE MV Area (PHT): 2.46 cm    TR Peak grad:   28.3 mmHg MV Area VTI:   3.81 cm    TR Vmax:        266.00 cm/s MV Peak grad:  3.3 mmHg MV Mean grad:  1.0 mmHg    SHUNTS MV Vmax:       0.92 m/s    Systemic VTI:  0.36 m MV Vmean:      48.6 cm/s   Systemic Diam: 2.00 cm MV Decel Time: 308 msec MV E velocity: 69.00 cm/s MV A velocity: 91.30 cm/s MV E/A ratio:  0.76 Oswaldo Milian MD Electronically signed by Oswaldo Milian MD Signature Date/Time: 07/04/2021/11:13:35 AM    Final    CT HEAD CODE STROKE WO CONTRAST  Result Date: 07/03/2021 CLINICAL DATA:  Code stroke.  Left-sided weakness and numbness EXAM: CT HEAD WITHOUT CONTRAST TECHNIQUE: Contiguous axial images were obtained from the base of the skull through the vertex without intravenous contrast. COMPARISON:  CT head 05/13/2021, MR head  05/14/2021 FINDINGS: Brain: There is no evidence of acute intracranial hemorrhage, extra-axial fluid collection, or acute infarct. Parenchymal volume is normal. The ventricles are normal in size. A small remote lacunar infarct in the left caudate head is unchanged. There is no mass lesion. There is no midline shift. Vascular: No hyperdense vessel or unexpected calcification. Skull: Normal. Negative for fracture or focal lesion. Sinuses/Orbits: Imaged paranasal sinuses are clear. The globes and orbits are unremarkable. Other: None. ASPECTS [facility] Stroke Program Early CT Score) - Ganglionic level infarction (caudate, lentiform nuclei, internal capsule, insula, M1-M3 cortex): 7 - Supraganglionic infarction (M4-M6 cortex): 3 Total score (0-10 with 10 being normal): 10 IMPRESSION: 1. No acute intracranial pathology. 2. ASPECTS is 10 Electronically Signed   By: Valetta Mole M.D.   On: 07/03/2021 11:22   CT ANGIO HEAD NECK W WO CM (CODE STROKE)  Result Date: 07/03/2021 CLINICAL DATA:  Left-sided weakness and numbness EXAM: CT ANGIOGRAPHY HEAD AND NECK TECHNIQUE: Multidetector CT imaging of the head and neck was performed using the standard protocol during bolus administration of intravenous contrast. Multiplanar CT image reconstructions and MIPs were obtained to evaluate the vascular anatomy. Carotid stenosis measurements (when applicable) are obtained utilizing NASCET criteria, using the distal internal carotid diameter as the denominator. CONTRAST:  61mL OMNIPAQUE IOHEXOL 350 MG/ML SOLN COMPARISON:  Same-day noncontrast CT head, MR head 05/14/2021, CTA head and neck 03/30/2021 FINDINGS: CTA NECK FINDINGS Aortic arch: Standard branching. Imaged portion shows no evidence of aneurysm or dissection. No significant stenosis of the major arch vessel origins. Right carotid system: The right common, internal, and external carotid arteries are patent, without hemodynamically significant stenosis or occlusion. There is no  dissection or aneurysm. Left carotid system: The left common, internal, and external carotid arteries are patent, without hemodynamically significant stenosis or occlusion. There is no dissection or aneurysm. There is minimal calcified plaque in the left carotid bulb. Vertebral arteries: The vertebral arteries are patent, without hemodynamically significant stenosis or occlusion. There is no dissection or aneurysm. Skeleton: There is mild degenerative change of the cervical spine. There is no visible canal hematoma. There is no acute osseous abnormality or aggressive osseous lesion. Other neck: The soft tissues are unremarkable. Upper chest: The imaged lung apices are clear. Review of the MIP images confirms the above findings CTA  HEAD FINDINGS Anterior circulation: The intracranial ICAs are patent, without hemodynamically significant stenosis or occlusion. The bilateral MCAs are patent. The bilateral ACAs are patent. The anterior communicating artery is normal. There is no aneurysm. Posterior circulation: The bilateral V4 segments are patent. PICA is identified bilaterally. The basilar artery is patent. The bilateral PCAs are patent. The left posterior communicating artery is identified. The right posterior communicating artery is not seen. There is no aneurysm. Venous sinuses: As permitted by contrast timing, patent. Anatomic variants: None. Review of the MIP images confirms the above findings IMPRESSION: Patent vasculature of the head and neck with no hemodynamically significant stenosis or occlusion. No dissection or aneurysm. Electronically Signed   By: Valetta Mole M.D.   On: 07/03/2021 12:11        Discharge Exam: Vitals:   07/04/21 0517 07/04/21 1352  BP: 129/76 124/69  Pulse: 63 64  Resp: 18 14  Temp: 97.9 F (36.6 C) 97.8 F (36.6 C)  SpO2: 98% 100%   Vitals:   07/03/21 2200 07/04/21 0008 07/04/21 0517 07/04/21 1352  BP: 126/75 123/76 129/76 124/69  Pulse: 78 82 63 64  Resp: 20 18 18  14   Temp: 98.1 F (36.7 C) 98.3 F (36.8 C) 97.9 F (36.6 C) 97.8 F (36.6 C)  TempSrc: Oral   Oral  SpO2: 97% 97% 98% 100%  Weight:      Height:        General: Pt is alert, awake, not in acute distress Cardiovascular: RRR, S1/S2 +, no rubs, no gallops Respiratory: CTA bilaterally, no wheezing, no rhonchi Abdominal: Soft, NT, ND, bowel sounds + Extremities: no edema, no cyanosis Neuro:  CN II-XII intact, strength 4/5 in RUE, RLE, strength 4/5 LUE, LLE; sensation intact bilateral; no dysmetria; babinski equivocal    The results of significant diagnostics from this hospitalization (including imaging, microbiology, ancillary and laboratory) are listed below for reference.    Significant Diagnostic Studies: MR BRAIN WO CONTRAST  Result Date: 07/03/2021 CLINICAL DATA:  Neuro deficit, acute, stroke suspected EXAM: MRI HEAD WITHOUT CONTRAST TECHNIQUE: Multiplanar, multiecho pulse sequences of the brain and surrounding structures were obtained without intravenous contrast. COMPARISON:  Same day CT head.  MRI May 14, 2021. FINDINGS: Brain: No acute infarction, hemorrhage, hydrocephalus, extra-axial collection or mass lesion. Small remote infarct in the left caudate. Vascular: Major arterial flow voids are maintained at the skull base. Skull and upper cervical spine: Normal marrow signal. Sinuses/Orbits: Mild paranasal sinus mucosal thickening. Unremarkable orbits. Other: No mastoid effusions. IMPRESSION: 1. No evidence of acute intracranial abnormality. 2. 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 Electronically Signed   By: Margaretha Sheffield M.D.   On: 07/03/2021 14:02   ECHOCARDIOGRAM COMPLETE  Result Date: 07/04/2021    ECHOCARDIOGRAM REPORT   Patient Name:   Marcus Dunn Date of Exam: 07/04/2021 Medical Rec #:  [id number]       Height:       76.0 in Accession #:    [id number]      Weight:       203.0 lb Date of Birth:  05/22/1966       BSA:          2.229 m Patient Age:    10  years        BP:           129/76 mmHg Patient Gender: M               HR:  65 bpm. Exam Location:  [facility] Procedure: 2D Echo, Cardiac Doppler and Color Doppler Indications:    Stroke  History:        Patient has prior history of Echocardiogram examinations, most                 recent 03/31/2021. Previous Myocardial Infarction,                 Signs/Symptoms:Syncope and Chest Pain; Risk                 Factors:Hypertension.  Sonographer:    Wenda Low Referring Phys: [place]  1. Left ventricular ejection fraction, by estimation, is 65 to 70%. The left ventricle has normal function. The left ventricle has no regional wall motion abnormalities. There is moderate asymmetric left ventricular hypertrophy of the basal-septal segment. Left ventricular diastolic parameters were normal.  2. Right ventricular systolic function is normal. The right ventricular size is mildly enlarged.  3. Right atrial size was mildly dilated.  4. The mitral valve is normal in structure. Trivial mitral valve regurgitation. No evidence of mitral stenosis.  5. The aortic valve is tricuspid. Aortic valve regurgitation is not visualized. No aortic stenosis is present. FINDINGS  Left Ventricle: Left ventricular ejection fraction, by estimation, is 65 to 70%. The left ventricle has normal function. The left ventricle has no regional wall motion abnormalities. The left ventricular internal cavity size was normal in size. There is  moderate asymmetric left ventricular hypertrophy of the basal-septal segment. Left ventricular diastolic parameters were normal. Right Ventricle: The right ventricular size is mildly enlarged. No increase in right ventricular wall thickness. Right ventricular systolic function is normal. Left Atrium: Left atrial size was normal in size. Right Atrium: Right atrial size was mildly dilated. Pericardium: There is no evidence of pericardial effusion. Mitral Valve: The mitral valve is  normal in structure. Trivial mitral valve regurgitation. No evidence of mitral valve stenosis. MV peak gradient, 3.3 mmHg. The mean mitral valve gradient is 1.0 mmHg. Tricuspid Valve: The tricuspid valve is normal in structure. Tricuspid valve regurgitation is trivial. Aortic Valve: The aortic valve is tricuspid. Aortic valve regurgitation is not visualized. No aortic stenosis is present. Aortic valve mean gradient measures 5.0 mmHg. Aortic valve peak gradient measures 10.4 mmHg. Aortic valve area, by VTI measures 3.43  cm. Pulmonic Valve: The pulmonic valve was grossly normal. Pulmonic valve regurgitation is not visualized. Aorta: The aortic root and ascending aorta are structurally normal, with no evidence of dilitation. IAS/Shunts: The interatrial septum was not well visualized.  LEFT VENTRICLE PLAX 2D LVIDd:         4.60 cm     Diastology LVIDs:         3.40 cm     LV e' medial:    7.07 cm/s LV PW:         1.50 cm     LV E/e' medial:  9.8 LV IVS:        1.50 cm     LV e' lateral:   12.30 cm/s LVOT diam:     2.00 cm     LV E/e' lateral: 5.6 LV SV:         114 LV SV Index:   51 LVOT Area:     3.14 cm  LV Volumes (MOD) LV vol d, MOD A2C: 72.8 ml LV vol d, MOD A4C: 86.6 ml LV vol s, MOD A2C: 18.4 ml LV vol s, MOD A4C: 31.0  ml LV SV MOD A2C:     54.4 ml LV SV MOD A4C:     86.6 ml LV SV MOD BP:      58.5 ml RIGHT VENTRICLE RV Basal diam:  4.00 cm RV Mid diam:    3.50 cm RV S prime:     17.30 cm/s TAPSE (M-mode): 2.8 cm LEFT ATRIUM             Index        RIGHT ATRIUM           Index LA diam:        4.10 cm 1.84 cm/m   RA Area:     22.50 cm LA Vol (A2C):   61.1 ml 27.41 ml/m  RA Volume:   73.70 ml  33.06 ml/m LA Vol (A4C):   75.0 ml 33.64 ml/m LA Biplane Vol: 67.8 ml 30.41 ml/m  AORTIC VALVE                     PULMONIC VALVE AV Area (Vmax):    2.97 cm      PV Vmax:       0.85 m/s AV Area (Vmean):   2.90 cm      PV Peak grad:  2.9 mmHg AV Area (VTI):     3.43 cm AV Vmax:           161.00 cm/s AV Vmean:           103.000 cm/s AV VTI:            0.333 m AV Peak Grad:      10.4 mmHg AV Mean Grad:      5.0 mmHg LVOT Vmax:         152.00 cm/s LVOT Vmean:        95.100 cm/s LVOT VTI:          0.364 m LVOT/AV VTI ratio: 1.09  AORTA Ao Root diam: 3.40 cm Ao Asc diam:  3.50 cm MITRAL VALVE               TRICUSPID VALVE MV Area (PHT): 2.46 cm    TR Peak grad:   28.3 mmHg MV Area VTI:   3.81 cm    TR Vmax:        266.00 cm/s MV Peak grad:  3.3 mmHg MV Mean grad:  1.0 mmHg    SHUNTS MV Vmax:       0.92 m/s    Systemic VTI:  0.36 m MV Vmean:      48.6 cm/s   Systemic Diam: 2.00 cm MV Decel Time: 308 msec MV E velocity: 69.00 cm/s MV A velocity: 91.30 cm/s MV E/A ratio:  0.76 Oswaldo Milian MD Electronically signed by Oswaldo Milian MD Signature Date/Time: 07/04/2021/11:13:35 AM    Final    CT HEAD CODE STROKE WO CONTRAST  Result Date: 07/03/2021 CLINICAL DATA:  Code stroke.  Left-sided weakness and numbness EXAM: CT HEAD WITHOUT CONTRAST TECHNIQUE: Contiguous axial images were obtained from the base of the skull through the vertex without intravenous contrast. COMPARISON:  CT head 05/13/2021, MR head 05/14/2021 FINDINGS: Brain: There is no evidence of acute intracranial hemorrhage, extra-axial fluid collection, or acute infarct. Parenchymal volume is normal. The ventricles are normal in size. A small remote lacunar infarct in the left caudate head is unchanged. There is no mass lesion. There is no midline shift. Vascular: No hyperdense vessel or unexpected calcification. Skull: Normal. Negative for fracture or  focal lesion. Sinuses/Orbits: Imaged paranasal sinuses are clear. The globes and orbits are unremarkable. Other: None. ASPECTS [facility] Stroke Program Early CT Score) - Ganglionic level infarction (caudate, lentiform nuclei, internal capsule, insula, M1-M3 cortex): 7 - Supraganglionic infarction (M4-M6 cortex): 3 Total score (0-10 with 10 being normal): 10 IMPRESSION: 1. No acute intracranial pathology. 2.  ASPECTS is 10 Electronically Signed   By: Valetta Mole M.D.   On: 07/03/2021 11:22   CT ANGIO HEAD NECK W WO CM (CODE STROKE)  Result Date: 07/03/2021 CLINICAL DATA:  Left-sided weakness and numbness EXAM: CT ANGIOGRAPHY HEAD AND NECK TECHNIQUE: Multidetector CT imaging of the head and neck was performed using the standard protocol during bolus administration of intravenous contrast. Multiplanar CT image reconstructions and MIPs were obtained to evaluate the vascular anatomy. Carotid stenosis measurements (when applicable) are obtained utilizing NASCET criteria, using the distal internal carotid diameter as the denominator. CONTRAST:  42mL OMNIPAQUE IOHEXOL 350 MG/ML SOLN COMPARISON:  Same-day noncontrast CT head, MR head 05/14/2021, CTA head and neck 03/30/2021 FINDINGS: CTA NECK FINDINGS Aortic arch: Standard branching. Imaged portion shows no evidence of aneurysm or dissection. No significant stenosis of the major arch vessel origins. Right carotid system: The right common, internal, and external carotid arteries are patent, without hemodynamically significant stenosis or occlusion. There is no dissection or aneurysm. Left carotid system: The left common, internal, and external carotid arteries are patent, without hemodynamically significant stenosis or occlusion. There is no dissection or aneurysm. There is minimal calcified plaque in the left carotid bulb. Vertebral arteries: The vertebral arteries are patent, without hemodynamically significant stenosis or occlusion. There is no dissection or aneurysm. Skeleton: There is mild degenerative change of the cervical spine. There is no visible canal hematoma. There is no acute osseous abnormality or aggressive osseous lesion. Other neck: The soft tissues are unremarkable. Upper chest: The imaged lung apices are clear. Review of the MIP images confirms the above findings CTA HEAD FINDINGS Anterior circulation: The intracranial ICAs are patent, without  hemodynamically significant stenosis or occlusion. The bilateral MCAs are patent. The bilateral ACAs are patent. The anterior communicating artery is normal. There is no aneurysm. Posterior circulation: The bilateral V4 segments are patent. PICA is identified bilaterally. The basilar artery is patent. The bilateral PCAs are patent. The left posterior communicating artery is identified. The right posterior communicating artery is not seen. There is no aneurysm. Venous sinuses: As permitted by contrast timing, patent. Anatomic variants: None. Review of the MIP images confirms the above findings IMPRESSION: Patent vasculature of the head and neck with no hemodynamically significant stenosis or occlusion. No dissection or aneurysm. Electronically Signed   By: Valetta Mole M.D.   On: 07/03/2021 12:11    Microbiology: Recent Results (from the past 240 hour(s))  Resp Panel by RT-PCR (Flu A&B, Covid) Nasopharyngeal Swab     Status: None   Collection Time: 07/03/21  8:30 PM   Specimen: Nasopharyngeal Swab; Nasopharyngeal(NP) swabs in vial transport medium  Result Value Ref Range Status   SARS Coronavirus 2 by RT PCR NEGATIVE NEGATIVE Final    Comment: (NOTE) SARS-CoV-2 target nucleic acids are NOT DETECTED.  The SARS-CoV-2 RNA is generally detectable in upper respiratory specimens during the acute phase of infection. The lowest concentration of SARS-CoV-2 viral copies this assay can detect is 138 copies/mL. A negative result does not preclude SARS-Cov-2 infection and should not be used as the sole basis for treatment or other patient management decisions. A negative result may occur with  improper specimen collection/handling, submission of specimen other than nasopharyngeal swab, presence of viral mutation(s) within the areas targeted by this assay, and inadequate number of viral copies(<138 copies/mL). A negative result must be combined with clinical observations, patient history, and  epidemiological information. The expected result is Negative.  Fact Sheet for Patients:  EntrepreneurPulse.com.au  Fact Sheet for Healthcare Providers:  IncredibleEmployment.be  This test is no t yet approved or cleared by the [place] FDA and  has been authorized for detection and/or diagnosis of SARS-CoV-2 by FDA under an Emergency Use Authorization (EUA). This EUA will remain  in effect (meaning this test can be used) for the duration of the COVID-19 declaration under Section 564(b)(1) of the Act, 21 U.S.C.section 360bbb-3(b)(1), unless the authorization is terminated  or revoked sooner.       Influenza A by PCR NEGATIVE NEGATIVE Final   Influenza B by PCR NEGATIVE NEGATIVE Final    Comment: (NOTE) The Xpert Xpress SARS-CoV-2/FLU/RSV plus assay is intended as an aid in the diagnosis of influenza from Nasopharyngeal swab specimens and should not be used as a sole basis for treatment. Nasal washings and aspirates are unacceptable for Xpert Xpress SARS-CoV-2/FLU/RSV testing.  Fact Sheet for Patients: EntrepreneurPulse.com.au  Fact Sheet for Healthcare Providers: IncredibleEmployment.be  This test is not yet approved or cleared by the [place] FDA and has been authorized for detection and/or diagnosis of SARS-CoV-2 by FDA under an Emergency Use Authorization (EUA). This EUA will remain in effect (meaning this test can be used) for the duration of the COVID-19 declaration under Section 564(b)(1) of the Act, 21 U.S.C. section 360bbb-3(b)(1), unless the authorization is terminated or revoked.  Performed at [facility], [address] [place], [place] [address]      Labs: Basic Metabolic Panel: Recent Labs  Lab 07/03/21 1125  NA 140  K 4.1  CL 108  CO2 24  GLUCOSE 77  BUN 15  CREATININE 1.14  CALCIUM 8.4*   Liver Function Tests: Recent Labs  Lab 07/03/21 1125  AST 28  ALT 24   ALKPHOS 65  BILITOT 1.2  PROT 6.8  ALBUMIN 3.9   No results for input(s): LIPASE, AMYLASE in the last 168 hours. No results for input(s): AMMONIA in the last 168 hours. CBC: Recent Labs  Lab 07/03/21 1125  WBC 4.6  NEUTROABS 2.3  HGB 13.7  HCT 41.4  MCV 103.5*  PLT 185   Cardiac Enzymes: No results for input(s): CKTOTAL, CKMB, CKMBINDEX, TROPONINI in the last 168 hours. BNP: Invalid input(s): POCBNP CBG: Recent Labs  Lab 07/03/21 1104  GLUCAP 85    Time coordinating discharge:  36 minutes  Signed:  Orson Eva, DO [facility] Pager: [phone] 07/04/2021, 2:40 PM

## 2021-07-05 LAB — RPR: RPR Ser Ql: NONREACTIVE
